# Patient Record
Sex: Female | Born: 1935 | Race: White | Hispanic: No | Marital: Married | Smoking: Never smoker
Health system: Southern US, Community
[De-identification: ages and names within clinical notes are randomized; demographics above are authoritative.]

## PROBLEM LIST (undated history)

## (undated) DIAGNOSIS — I480 Paroxysmal atrial fibrillation: Secondary | ICD-10-CM

## (undated) DIAGNOSIS — M25512 Pain in left shoulder: Secondary | ICD-10-CM

## (undated) DIAGNOSIS — R7989 Other specified abnormal findings of blood chemistry: Secondary | ICD-10-CM

## (undated) DIAGNOSIS — I2510 Atherosclerotic heart disease of native coronary artery without angina pectoris: Secondary | ICD-10-CM

## (undated) DIAGNOSIS — D539 Nutritional anemia, unspecified: Secondary | ICD-10-CM

## (undated) DIAGNOSIS — M7540 Impingement syndrome of unspecified shoulder: Secondary | ICD-10-CM

## (undated) DIAGNOSIS — M129 Arthropathy, unspecified: Secondary | ICD-10-CM

## (undated) DIAGNOSIS — F419 Anxiety disorder, unspecified: Secondary | ICD-10-CM

## (undated) DIAGNOSIS — M533 Sacrococcygeal disorders, not elsewhere classified: Secondary | ICD-10-CM

## (undated) DIAGNOSIS — I7300 Raynaud's syndrome without gangrene: Secondary | ICD-10-CM

## (undated) DIAGNOSIS — M1990 Unspecified osteoarthritis, unspecified site: Secondary | ICD-10-CM

## (undated) DIAGNOSIS — M76829 Posterior tibial tendinitis, unspecified leg: Secondary | ICD-10-CM

## (undated) DIAGNOSIS — R5383 Other fatigue: Secondary | ICD-10-CM

## (undated) DIAGNOSIS — M19079 Primary osteoarthritis, unspecified ankle and foot: Secondary | ICD-10-CM

## (undated) DIAGNOSIS — I779 Disorder of arteries and arterioles, unspecified: Secondary | ICD-10-CM

## (undated) DIAGNOSIS — M545 Low back pain, unspecified: Secondary | ICD-10-CM

## (undated) DIAGNOSIS — I739 Peripheral vascular disease, unspecified: Secondary | ICD-10-CM

## (undated) DIAGNOSIS — I10 Essential (primary) hypertension: Secondary | ICD-10-CM

## (undated) DIAGNOSIS — D649 Anemia, unspecified: Secondary | ICD-10-CM

## (undated) DIAGNOSIS — M25511 Pain in right shoulder: Secondary | ICD-10-CM

## (undated) DIAGNOSIS — G8929 Other chronic pain: Secondary | ICD-10-CM

## (undated) DIAGNOSIS — K279 Peptic ulcer, site unspecified, unspecified as acute or chronic, without hemorrhage or perforation: Secondary | ICD-10-CM

## (undated) DIAGNOSIS — F32A Depression, unspecified: Secondary | ICD-10-CM

## (undated) DIAGNOSIS — M810 Age-related osteoporosis without current pathological fracture: Secondary | ICD-10-CM

## (undated) DIAGNOSIS — K5730 Diverticulosis of large intestine without perforation or abscess without bleeding: Secondary | ICD-10-CM

## (undated) DIAGNOSIS — E785 Hyperlipidemia, unspecified: Secondary | ICD-10-CM

## (undated) DIAGNOSIS — M4800 Spinal stenosis, site unspecified: Secondary | ICD-10-CM

## (undated) DIAGNOSIS — K559 Vascular disorder of intestine, unspecified: Secondary | ICD-10-CM

## (undated) DIAGNOSIS — F329 Major depressive disorder, single episode, unspecified: Secondary | ICD-10-CM

## (undated) DIAGNOSIS — R112 Nausea with vomiting, unspecified: Secondary | ICD-10-CM

## (undated) DIAGNOSIS — H8110 Benign paroxysmal vertigo, unspecified ear: Secondary | ICD-10-CM

## (undated) HISTORY — DX: Primary osteoarthritis, unspecified ankle and foot: M19.079

## (undated) HISTORY — DX: Peripheral vascular disease, unspecified: I73.9

## (undated) HISTORY — DX: Nausea with vomiting, unspecified: R11.2

## (undated) HISTORY — PX: APPENDECTOMY: SHX54

## (undated) HISTORY — DX: Other specified abnormal findings of blood chemistry: R79.89

## (undated) HISTORY — DX: Low back pain: M54.5

## (undated) HISTORY — DX: Depression, unspecified: F32.A

## (undated) HISTORY — DX: Paroxysmal atrial fibrillation: I48.0

## (undated) HISTORY — PX: LAPAROTOMY: SHX154

## (undated) HISTORY — DX: Hyperlipidemia, unspecified: E78.5

## (undated) HISTORY — DX: Hypercalcemia: E83.52

## (undated) HISTORY — DX: Essential (primary) hypertension: I10

## (undated) HISTORY — DX: Sacrococcygeal disorders, not elsewhere classified: M53.3

## (undated) HISTORY — DX: Other fatigue: R53.83

## (undated) HISTORY — DX: Impingement syndrome of unspecified shoulder: M75.40

## (undated) HISTORY — DX: Arthropathy, unspecified: M12.9

## (undated) HISTORY — DX: Unspecified osteoarthritis, unspecified site: M19.90

## (undated) HISTORY — DX: Atherosclerotic heart disease of native coronary artery without angina pectoris: I25.10

## (undated) HISTORY — DX: Other chronic pain: G89.29

## (undated) HISTORY — PX: CATARACT EXTRACTION: SUR2

## (undated) HISTORY — DX: Peptic ulcer, site unspecified, unspecified as acute or chronic, without hemorrhage or perforation: K27.9

## (undated) HISTORY — DX: Anxiety disorder, unspecified: F41.9

## (undated) HISTORY — DX: Spinal stenosis, site unspecified: M48.00

## (undated) HISTORY — DX: Anemia, unspecified: D64.9

## (undated) HISTORY — DX: Posterior tibial tendinitis, unspecified leg: M76.829

## (undated) HISTORY — DX: Nutritional anemia, unspecified: D53.9

## (undated) HISTORY — DX: Age-related osteoporosis without current pathological fracture: M81.0

## (undated) HISTORY — DX: Benign paroxysmal vertigo, unspecified ear: H81.10

## (undated) HISTORY — DX: Low back pain, unspecified: M54.50

## (undated) HISTORY — DX: Raynaud's syndrome without gangrene: I73.00

## (undated) HISTORY — PX: CORONARY ARTERY BYPASS GRAFT: SHX141

## (undated) HISTORY — DX: Pain in right shoulder: M25.511

## (undated) HISTORY — DX: Major depressive disorder, single episode, unspecified: F32.9

## (undated) HISTORY — DX: Disorder of arteries and arterioles, unspecified: I77.9

## (undated) HISTORY — PX: CHOLECYSTECTOMY: SHX55

## (undated) HISTORY — DX: Pain in left shoulder: M25.512

---

## 1996-02-28 HISTORY — PX: HIATAL HERNIA REPAIR: SHX195

## 1999-03-23 ENCOUNTER — Encounter: Admission: RE | Admit: 1999-03-23 | Discharge: 1999-03-23 | Payer: Self-pay | Admitting: Orthopedic Surgery

## 1999-03-23 ENCOUNTER — Encounter: Payer: Self-pay | Admitting: Orthopedic Surgery

## 1999-03-25 ENCOUNTER — Ambulatory Visit (HOSPITAL_BASED_OUTPATIENT_CLINIC_OR_DEPARTMENT_OTHER): Admission: RE | Admit: 1999-03-25 | Discharge: 1999-03-25 | Payer: Self-pay | Admitting: Orthopedic Surgery

## 1999-12-30 ENCOUNTER — Encounter: Admission: RE | Admit: 1999-12-30 | Discharge: 1999-12-30 | Payer: Self-pay | Admitting: *Deleted

## 1999-12-30 ENCOUNTER — Encounter: Payer: Self-pay | Admitting: *Deleted

## 2001-05-08 ENCOUNTER — Ambulatory Visit (HOSPITAL_COMMUNITY): Admission: RE | Admit: 2001-05-08 | Discharge: 2001-05-08 | Payer: Self-pay | Admitting: Cardiology

## 2001-05-08 ENCOUNTER — Encounter: Payer: Self-pay | Admitting: Cardiology

## 2001-09-03 ENCOUNTER — Ambulatory Visit (HOSPITAL_COMMUNITY): Admission: RE | Admit: 2001-09-03 | Discharge: 2001-09-03 | Payer: Self-pay | Admitting: Family Medicine

## 2001-09-03 ENCOUNTER — Encounter: Payer: Self-pay | Admitting: Family Medicine

## 2001-09-05 ENCOUNTER — Ambulatory Visit (HOSPITAL_COMMUNITY): Admission: RE | Admit: 2001-09-05 | Discharge: 2001-09-05 | Payer: Self-pay | Admitting: Family Medicine

## 2001-09-05 ENCOUNTER — Encounter: Payer: Self-pay | Admitting: Family Medicine

## 2001-11-20 ENCOUNTER — Encounter: Admission: RE | Admit: 2001-11-20 | Discharge: 2001-11-20 | Payer: Self-pay | Admitting: Family Medicine

## 2001-11-20 ENCOUNTER — Encounter: Payer: Self-pay | Admitting: Family Medicine

## 2001-11-27 ENCOUNTER — Ambulatory Visit (HOSPITAL_COMMUNITY): Admission: RE | Admit: 2001-11-27 | Discharge: 2001-11-27 | Payer: Self-pay | Admitting: Family Medicine

## 2001-11-27 ENCOUNTER — Encounter: Payer: Self-pay | Admitting: Family Medicine

## 2002-02-23 ENCOUNTER — Inpatient Hospital Stay (HOSPITAL_COMMUNITY): Admission: EM | Admit: 2002-02-23 | Discharge: 2002-02-25 | Payer: Self-pay | Admitting: *Deleted

## 2002-02-23 ENCOUNTER — Encounter: Payer: Self-pay | Admitting: Emergency Medicine

## 2002-02-25 ENCOUNTER — Encounter: Payer: Self-pay | Admitting: Cardiology

## 2002-05-29 ENCOUNTER — Encounter: Payer: Self-pay | Admitting: Family Medicine

## 2002-05-29 ENCOUNTER — Ambulatory Visit (HOSPITAL_COMMUNITY): Admission: RE | Admit: 2002-05-29 | Discharge: 2002-05-29 | Payer: Self-pay | Admitting: Family Medicine

## 2002-09-03 ENCOUNTER — Ambulatory Visit (HOSPITAL_COMMUNITY): Admission: RE | Admit: 2002-09-03 | Discharge: 2002-09-03 | Payer: Self-pay | Admitting: Internal Medicine

## 2002-11-25 ENCOUNTER — Ambulatory Visit (HOSPITAL_COMMUNITY): Admission: RE | Admit: 2002-11-25 | Discharge: 2002-11-25 | Payer: Self-pay | Admitting: Family Medicine

## 2002-11-25 ENCOUNTER — Encounter: Payer: Self-pay | Admitting: Family Medicine

## 2002-12-01 ENCOUNTER — Ambulatory Visit (HOSPITAL_COMMUNITY): Admission: RE | Admit: 2002-12-01 | Discharge: 2002-12-01 | Payer: Self-pay | Admitting: Family Medicine

## 2002-12-01 ENCOUNTER — Encounter: Payer: Self-pay | Admitting: Family Medicine

## 2002-12-08 ENCOUNTER — Encounter: Payer: Self-pay | Admitting: Family Medicine

## 2002-12-08 ENCOUNTER — Ambulatory Visit (HOSPITAL_COMMUNITY): Admission: RE | Admit: 2002-12-08 | Discharge: 2002-12-08 | Payer: Self-pay | Admitting: Family Medicine

## 2003-10-26 ENCOUNTER — Ambulatory Visit (HOSPITAL_COMMUNITY): Admission: RE | Admit: 2003-10-26 | Discharge: 2003-10-26 | Payer: Self-pay | Admitting: Family Medicine

## 2003-12-03 ENCOUNTER — Ambulatory Visit (HOSPITAL_COMMUNITY): Admission: RE | Admit: 2003-12-03 | Discharge: 2003-12-03 | Payer: Self-pay | Admitting: Podiatry

## 2003-12-03 ENCOUNTER — Ambulatory Visit (HOSPITAL_COMMUNITY): Admission: RE | Admit: 2003-12-03 | Discharge: 2003-12-03 | Payer: Self-pay | Admitting: Family Medicine

## 2003-12-30 ENCOUNTER — Ambulatory Visit (HOSPITAL_COMMUNITY): Admission: RE | Admit: 2003-12-30 | Discharge: 2003-12-30 | Payer: Self-pay | Admitting: Podiatry

## 2004-03-18 ENCOUNTER — Ambulatory Visit: Payer: Self-pay | Admitting: *Deleted

## 2004-03-29 ENCOUNTER — Ambulatory Visit: Payer: Self-pay

## 2004-04-07 ENCOUNTER — Ambulatory Visit: Payer: Self-pay | Admitting: *Deleted

## 2004-11-24 ENCOUNTER — Ambulatory Visit: Payer: Self-pay | Admitting: Internal Medicine

## 2004-12-01 ENCOUNTER — Other Ambulatory Visit: Admission: RE | Admit: 2004-12-01 | Discharge: 2004-12-01 | Payer: Self-pay | Admitting: Dermatology

## 2004-12-05 ENCOUNTER — Ambulatory Visit (HOSPITAL_COMMUNITY): Admission: RE | Admit: 2004-12-05 | Discharge: 2004-12-05 | Payer: Self-pay | Admitting: Family Medicine

## 2004-12-07 ENCOUNTER — Ambulatory Visit (HOSPITAL_COMMUNITY): Admission: RE | Admit: 2004-12-07 | Discharge: 2004-12-07 | Payer: Self-pay | Admitting: Internal Medicine

## 2004-12-07 ENCOUNTER — Ambulatory Visit: Payer: Self-pay | Admitting: Internal Medicine

## 2004-12-07 ENCOUNTER — Encounter (INDEPENDENT_AMBULATORY_CARE_PROVIDER_SITE_OTHER): Payer: Self-pay | Admitting: Internal Medicine

## 2005-03-06 ENCOUNTER — Ambulatory Visit: Payer: Self-pay | Admitting: *Deleted

## 2005-06-19 ENCOUNTER — Ambulatory Visit (HOSPITAL_COMMUNITY): Admission: RE | Admit: 2005-06-19 | Discharge: 2005-06-19 | Payer: Self-pay | Admitting: Family Medicine

## 2005-06-21 ENCOUNTER — Ambulatory Visit: Payer: Self-pay | Admitting: *Deleted

## 2005-06-21 ENCOUNTER — Inpatient Hospital Stay (HOSPITAL_COMMUNITY): Admission: EM | Admit: 2005-06-21 | Discharge: 2005-06-26 | Payer: Self-pay | Admitting: Emergency Medicine

## 2005-07-11 ENCOUNTER — Ambulatory Visit: Payer: Self-pay | Admitting: *Deleted

## 2005-08-07 ENCOUNTER — Ambulatory Visit (HOSPITAL_COMMUNITY): Admission: RE | Admit: 2005-08-07 | Discharge: 2005-08-07 | Payer: Self-pay | Admitting: Family Medicine

## 2005-11-02 ENCOUNTER — Ambulatory Visit: Payer: Self-pay | Admitting: Cardiology

## 2005-12-07 ENCOUNTER — Ambulatory Visit (HOSPITAL_COMMUNITY): Admission: RE | Admit: 2005-12-07 | Discharge: 2005-12-07 | Payer: Self-pay | Admitting: Family Medicine

## 2006-02-07 ENCOUNTER — Ambulatory Visit: Payer: Self-pay | Admitting: Cardiology

## 2006-02-07 ENCOUNTER — Ambulatory Visit: Payer: Self-pay

## 2006-02-27 DIAGNOSIS — K5730 Diverticulosis of large intestine without perforation or abscess without bleeding: Secondary | ICD-10-CM

## 2006-02-27 HISTORY — DX: Diverticulosis of large intestine without perforation or abscess without bleeding: K57.30

## 2006-03-03 ENCOUNTER — Encounter (INDEPENDENT_AMBULATORY_CARE_PROVIDER_SITE_OTHER): Payer: Self-pay | Admitting: Family Medicine

## 2006-06-09 ENCOUNTER — Encounter (INDEPENDENT_AMBULATORY_CARE_PROVIDER_SITE_OTHER): Payer: Self-pay | Admitting: Family Medicine

## 2006-07-03 ENCOUNTER — Ambulatory Visit: Payer: Self-pay | Admitting: Internal Medicine

## 2006-07-04 ENCOUNTER — Ambulatory Visit (HOSPITAL_COMMUNITY): Admission: RE | Admit: 2006-07-04 | Discharge: 2006-07-04 | Payer: Self-pay | Admitting: Internal Medicine

## 2006-07-10 ENCOUNTER — Encounter (INDEPENDENT_AMBULATORY_CARE_PROVIDER_SITE_OTHER): Payer: Self-pay | Admitting: Specialist

## 2006-07-10 ENCOUNTER — Ambulatory Visit: Payer: Self-pay | Admitting: Internal Medicine

## 2006-07-10 ENCOUNTER — Ambulatory Visit (HOSPITAL_COMMUNITY): Admission: RE | Admit: 2006-07-10 | Discharge: 2006-07-10 | Payer: Self-pay | Admitting: Internal Medicine

## 2006-07-20 ENCOUNTER — Encounter (INDEPENDENT_AMBULATORY_CARE_PROVIDER_SITE_OTHER): Payer: Self-pay | Admitting: Family Medicine

## 2006-08-27 ENCOUNTER — Ambulatory Visit: Payer: Self-pay | Admitting: Family Medicine

## 2006-08-27 DIAGNOSIS — K5900 Constipation, unspecified: Secondary | ICD-10-CM | POA: Insufficient documentation

## 2006-08-27 DIAGNOSIS — E039 Hypothyroidism, unspecified: Secondary | ICD-10-CM | POA: Insufficient documentation

## 2006-08-27 DIAGNOSIS — Z8711 Personal history of peptic ulcer disease: Secondary | ICD-10-CM

## 2006-08-27 DIAGNOSIS — M545 Low back pain: Secondary | ICD-10-CM

## 2006-08-27 DIAGNOSIS — M1990 Unspecified osteoarthritis, unspecified site: Secondary | ICD-10-CM | POA: Insufficient documentation

## 2006-08-27 DIAGNOSIS — M129 Arthropathy, unspecified: Secondary | ICD-10-CM | POA: Insufficient documentation

## 2006-08-27 DIAGNOSIS — I252 Old myocardial infarction: Secondary | ICD-10-CM

## 2006-08-27 DIAGNOSIS — F329 Major depressive disorder, single episode, unspecified: Secondary | ICD-10-CM | POA: Insufficient documentation

## 2006-08-27 DIAGNOSIS — E785 Hyperlipidemia, unspecified: Secondary | ICD-10-CM

## 2006-08-27 DIAGNOSIS — F411 Generalized anxiety disorder: Secondary | ICD-10-CM | POA: Insufficient documentation

## 2006-08-27 DIAGNOSIS — J309 Allergic rhinitis, unspecified: Secondary | ICD-10-CM | POA: Insufficient documentation

## 2006-08-28 ENCOUNTER — Encounter (INDEPENDENT_AMBULATORY_CARE_PROVIDER_SITE_OTHER): Payer: Self-pay | Admitting: Family Medicine

## 2006-08-28 ENCOUNTER — Telehealth (INDEPENDENT_AMBULATORY_CARE_PROVIDER_SITE_OTHER): Payer: Self-pay | Admitting: Family Medicine

## 2006-08-29 ENCOUNTER — Telehealth (INDEPENDENT_AMBULATORY_CARE_PROVIDER_SITE_OTHER): Payer: Self-pay | Admitting: *Deleted

## 2006-08-29 LAB — CONVERTED CEMR LAB
ALT: 15 units/L (ref 0–35)
AST: 15 units/L (ref 0–37)
Alkaline Phosphatase: 57 units/L (ref 39–117)
Basophils Absolute: 0.1 10*3/uL (ref 0.0–0.1)
Basophils Relative: 1 % (ref 0–1)
Calcium: 9.2 mg/dL (ref 8.4–10.5)
Chloride: 112 meq/L (ref 96–112)
Creatinine, Ser: 1.03 mg/dL (ref 0.40–1.20)
Eosinophils Absolute: 0.3 10*3/uL (ref 0.0–0.7)
HDL: 72 mg/dL (ref >39)
Hemoglobin, Urine: NEGATIVE
Ketones, ur: NEGATIVE mg/dL
LDL Cholesterol: 67 mg/dL (ref 0–99)
Leukocytes, UA: NEGATIVE
MCHC: 31.1 g/dL (ref 30.0–36.0)
MCV: 103.6 fL — ABNORMAL HIGH (ref 78.0–100.0)
Monocytes Absolute: 0.4 10*3/uL (ref 0.2–0.7)
Neutro Abs: 3.5 10*3/uL (ref 1.7–7.7)
Neutrophils Relative %: 64 % (ref 43–77)
Nitrite: NEGATIVE
Potassium: 4.9 meq/L (ref 3.5–5.3)
Protein, ur: NEGATIVE mg/dL
RDW: 13.8 % (ref 11.5–14.0)
TSH: 0.479 microintl units/mL (ref 0.350–5.50)
Total CHOL/HDL Ratio: 2.2
Urobilinogen, UA: 0.2 (ref 0.0–1.0)
pH: 5.5 (ref 5.0–8.0)

## 2006-08-30 ENCOUNTER — Encounter (INDEPENDENT_AMBULATORY_CARE_PROVIDER_SITE_OTHER): Payer: Self-pay | Admitting: Family Medicine

## 2006-09-04 ENCOUNTER — Telehealth (INDEPENDENT_AMBULATORY_CARE_PROVIDER_SITE_OTHER): Payer: Self-pay | Admitting: *Deleted

## 2006-09-04 ENCOUNTER — Ambulatory Visit (HOSPITAL_COMMUNITY): Admission: RE | Admit: 2006-09-04 | Discharge: 2006-09-04 | Payer: Self-pay | Admitting: Family Medicine

## 2006-09-04 ENCOUNTER — Ambulatory Visit: Payer: Self-pay | Admitting: Family Medicine

## 2006-09-04 DIAGNOSIS — I10 Essential (primary) hypertension: Secondary | ICD-10-CM | POA: Insufficient documentation

## 2006-09-04 LAB — CONVERTED CEMR LAB: HDL goal, serum: 40 mg/dL

## 2006-09-05 ENCOUNTER — Encounter (INDEPENDENT_AMBULATORY_CARE_PROVIDER_SITE_OTHER): Payer: Self-pay | Admitting: Family Medicine

## 2006-09-06 ENCOUNTER — Encounter (INDEPENDENT_AMBULATORY_CARE_PROVIDER_SITE_OTHER): Payer: Self-pay | Admitting: Family Medicine

## 2006-09-06 ENCOUNTER — Ambulatory Visit: Payer: Self-pay | Admitting: Internal Medicine

## 2006-09-06 ENCOUNTER — Telehealth (INDEPENDENT_AMBULATORY_CARE_PROVIDER_SITE_OTHER): Payer: Self-pay | Admitting: Family Medicine

## 2006-09-08 ENCOUNTER — Ambulatory Visit: Payer: Self-pay | Admitting: Internal Medicine

## 2006-09-08 ENCOUNTER — Ambulatory Visit: Payer: Self-pay | Admitting: Cardiovascular Disease

## 2006-09-08 ENCOUNTER — Inpatient Hospital Stay (HOSPITAL_COMMUNITY): Admission: EM | Admit: 2006-09-08 | Discharge: 2006-09-20 | Payer: Self-pay | Admitting: Emergency Medicine

## 2006-09-10 ENCOUNTER — Telehealth (INDEPENDENT_AMBULATORY_CARE_PROVIDER_SITE_OTHER): Payer: Self-pay | Admitting: Family Medicine

## 2006-09-11 ENCOUNTER — Telehealth (INDEPENDENT_AMBULATORY_CARE_PROVIDER_SITE_OTHER): Payer: Self-pay | Admitting: Family Medicine

## 2006-09-11 ENCOUNTER — Ambulatory Visit: Payer: Self-pay | Admitting: Cardiovascular Disease

## 2006-09-12 ENCOUNTER — Encounter (INDEPENDENT_AMBULATORY_CARE_PROVIDER_SITE_OTHER): Payer: Self-pay | Admitting: Emergency Medicine

## 2006-09-12 ENCOUNTER — Encounter: Payer: Self-pay | Admitting: Emergency Medicine

## 2006-09-12 ENCOUNTER — Telehealth (INDEPENDENT_AMBULATORY_CARE_PROVIDER_SITE_OTHER): Payer: Self-pay | Admitting: Family Medicine

## 2006-09-12 ENCOUNTER — Ambulatory Visit: Payer: Self-pay | Admitting: Vascular Surgery

## 2006-09-19 ENCOUNTER — Encounter (INDEPENDENT_AMBULATORY_CARE_PROVIDER_SITE_OTHER): Payer: Self-pay | Admitting: Family Medicine

## 2006-09-19 ENCOUNTER — Encounter: Payer: Self-pay | Admitting: Gastroenterology

## 2006-09-20 ENCOUNTER — Encounter (INDEPENDENT_AMBULATORY_CARE_PROVIDER_SITE_OTHER): Payer: Self-pay | Admitting: Family Medicine

## 2006-09-21 ENCOUNTER — Ambulatory Visit: Payer: Self-pay | Admitting: Gastroenterology

## 2006-09-21 ENCOUNTER — Ambulatory Visit: Payer: Self-pay | Admitting: Cardiology

## 2006-09-24 ENCOUNTER — Ambulatory Visit: Payer: Self-pay | Admitting: Family Medicine

## 2006-09-24 ENCOUNTER — Telehealth (INDEPENDENT_AMBULATORY_CARE_PROVIDER_SITE_OTHER): Payer: Self-pay | Admitting: *Deleted

## 2006-09-24 DIAGNOSIS — I4891 Unspecified atrial fibrillation: Secondary | ICD-10-CM | POA: Insufficient documentation

## 2006-09-24 DIAGNOSIS — K559 Vascular disorder of intestine, unspecified: Secondary | ICD-10-CM

## 2006-09-24 HISTORY — DX: Vascular disorder of intestine, unspecified: K55.9

## 2006-09-25 ENCOUNTER — Ambulatory Visit: Payer: Self-pay | Admitting: Cardiology

## 2006-09-25 ENCOUNTER — Encounter (INDEPENDENT_AMBULATORY_CARE_PROVIDER_SITE_OTHER): Payer: Self-pay | Admitting: Family Medicine

## 2006-09-25 ENCOUNTER — Telehealth (INDEPENDENT_AMBULATORY_CARE_PROVIDER_SITE_OTHER): Payer: Self-pay | Admitting: *Deleted

## 2006-09-25 ENCOUNTER — Emergency Department (HOSPITAL_COMMUNITY): Admission: EM | Admit: 2006-09-25 | Discharge: 2006-09-25 | Payer: Self-pay | Admitting: Emergency Medicine

## 2006-09-25 LAB — CONVERTED CEMR LAB
AST: 23 units/L (ref 0–37)
Albumin: 2.7 g/dL — ABNORMAL LOW (ref 3.5–5.2)
Alkaline Phosphatase: 74 units/L (ref 39–117)
BUN: 29 mg/dL — ABNORMAL HIGH (ref 6–23)
Basophils Relative: 1 % (ref 0–1)
Creatinine, Ser: 1.38 mg/dL — ABNORMAL HIGH (ref 0.40–1.20)
Eosinophils Absolute: 0.3 10*3/uL (ref 0.0–0.7)
Eosinophils Relative: 3 % (ref 0–5)
Glucose, Bld: 102 mg/dL — ABNORMAL HIGH (ref 70–99)
HCT: 36.8 % (ref 36.0–46.0)
Hemoglobin: 12.3 g/dL (ref 12.0–15.0)
Lymphs Abs: 1.2 10*3/uL (ref 0.7–3.3)
MCHC: 33.4 g/dL (ref 30.0–36.0)
MCV: 97.2 fL (ref 78.0–100.0)
Monocytes Absolute: 0.7 10*3/uL (ref 0.2–0.7)
Monocytes Relative: 9 % (ref 3–11)
Neutrophils Relative %: 71 % (ref 43–77)
Potassium: 5.6 meq/L — ABNORMAL HIGH (ref 3.5–5.3)
RBC: 3.78 M/uL — ABNORMAL LOW (ref 3.87–5.11)
WBC: 7.7 10*3/uL (ref 4.0–10.5)

## 2006-09-26 ENCOUNTER — Ambulatory Visit: Payer: Self-pay | Admitting: Family Medicine

## 2006-09-28 ENCOUNTER — Telehealth (INDEPENDENT_AMBULATORY_CARE_PROVIDER_SITE_OTHER): Payer: Self-pay | Admitting: Family Medicine

## 2006-09-28 ENCOUNTER — Ambulatory Visit: Payer: Self-pay | Admitting: Family Medicine

## 2006-09-29 ENCOUNTER — Encounter (INDEPENDENT_AMBULATORY_CARE_PROVIDER_SITE_OTHER): Payer: Self-pay | Admitting: Family Medicine

## 2006-10-01 ENCOUNTER — Encounter (INDEPENDENT_AMBULATORY_CARE_PROVIDER_SITE_OTHER): Payer: Self-pay | Admitting: Family Medicine

## 2006-10-01 ENCOUNTER — Telehealth (INDEPENDENT_AMBULATORY_CARE_PROVIDER_SITE_OTHER): Payer: Self-pay | Admitting: *Deleted

## 2006-10-01 LAB — CONVERTED CEMR LAB
BUN: 37 mg/dL — ABNORMAL HIGH (ref 6–23)
CO2: 19 meq/L (ref 19–32)
Calcium: 8.9 mg/dL (ref 8.4–10.5)
Chloride: 108 meq/L (ref 96–112)
Creatinine, Ser: 1.17 mg/dL (ref 0.40–1.20)
Eosinophils Relative: 6 % — ABNORMAL HIGH (ref 0–5)
HCT: 37.8 % (ref 36.0–46.0)
Hemoglobin: 11.8 g/dL — ABNORMAL LOW (ref 12.0–15.0)
Lymphocytes Relative: 19 % (ref 12–46)
MCHC: 31.2 g/dL (ref 30.0–36.0)
Monocytes Absolute: 0.7 10*3/uL (ref 0.2–0.7)
Monocytes Relative: 13 % — ABNORMAL HIGH (ref 3–11)
Neutro Abs: 3.2 10*3/uL (ref 1.7–7.7)
RBC: 3.7 M/uL — ABNORMAL LOW (ref 3.87–5.11)
Total Bilirubin: 0.2 mg/dL — ABNORMAL LOW (ref 0.3–1.2)

## 2006-10-03 ENCOUNTER — Telehealth (INDEPENDENT_AMBULATORY_CARE_PROVIDER_SITE_OTHER): Payer: Self-pay | Admitting: Family Medicine

## 2006-10-04 ENCOUNTER — Encounter (INDEPENDENT_AMBULATORY_CARE_PROVIDER_SITE_OTHER): Payer: Self-pay | Admitting: Family Medicine

## 2006-10-05 ENCOUNTER — Telehealth (INDEPENDENT_AMBULATORY_CARE_PROVIDER_SITE_OTHER): Payer: Self-pay | Admitting: Family Medicine

## 2006-10-08 ENCOUNTER — Encounter (INDEPENDENT_AMBULATORY_CARE_PROVIDER_SITE_OTHER): Payer: Self-pay | Admitting: Family Medicine

## 2006-10-10 ENCOUNTER — Encounter (INDEPENDENT_AMBULATORY_CARE_PROVIDER_SITE_OTHER): Payer: Self-pay | Admitting: Family Medicine

## 2006-10-11 ENCOUNTER — Ambulatory Visit: Payer: Self-pay | Admitting: Cardiovascular Disease

## 2006-10-12 ENCOUNTER — Telehealth (INDEPENDENT_AMBULATORY_CARE_PROVIDER_SITE_OTHER): Payer: Self-pay | Admitting: Family Medicine

## 2006-10-12 ENCOUNTER — Encounter (INDEPENDENT_AMBULATORY_CARE_PROVIDER_SITE_OTHER): Payer: Self-pay | Admitting: Family Medicine

## 2006-10-15 ENCOUNTER — Encounter (INDEPENDENT_AMBULATORY_CARE_PROVIDER_SITE_OTHER): Payer: Self-pay | Admitting: Family Medicine

## 2006-10-18 ENCOUNTER — Encounter (INDEPENDENT_AMBULATORY_CARE_PROVIDER_SITE_OTHER): Payer: Self-pay | Admitting: Family Medicine

## 2006-10-24 ENCOUNTER — Encounter (INDEPENDENT_AMBULATORY_CARE_PROVIDER_SITE_OTHER): Payer: Self-pay | Admitting: Family Medicine

## 2006-10-25 ENCOUNTER — Ambulatory Visit: Payer: Self-pay | Admitting: Cardiovascular Disease

## 2006-11-02 ENCOUNTER — Encounter (INDEPENDENT_AMBULATORY_CARE_PROVIDER_SITE_OTHER): Payer: Self-pay | Admitting: Family Medicine

## 2006-11-07 ENCOUNTER — Encounter (INDEPENDENT_AMBULATORY_CARE_PROVIDER_SITE_OTHER): Payer: Self-pay | Admitting: Family Medicine

## 2006-11-12 ENCOUNTER — Telehealth (INDEPENDENT_AMBULATORY_CARE_PROVIDER_SITE_OTHER): Payer: Self-pay | Admitting: Family Medicine

## 2006-11-15 ENCOUNTER — Encounter (INDEPENDENT_AMBULATORY_CARE_PROVIDER_SITE_OTHER): Payer: Self-pay | Admitting: Family Medicine

## 2006-11-22 ENCOUNTER — Encounter (INDEPENDENT_AMBULATORY_CARE_PROVIDER_SITE_OTHER): Payer: Self-pay | Admitting: Family Medicine

## 2006-11-28 ENCOUNTER — Encounter (INDEPENDENT_AMBULATORY_CARE_PROVIDER_SITE_OTHER): Payer: Self-pay | Admitting: Family Medicine

## 2006-12-05 ENCOUNTER — Encounter (INDEPENDENT_AMBULATORY_CARE_PROVIDER_SITE_OTHER): Payer: Self-pay | Admitting: Family Medicine

## 2006-12-10 ENCOUNTER — Ambulatory Visit (HOSPITAL_COMMUNITY): Admission: RE | Admit: 2006-12-10 | Discharge: 2006-12-10 | Payer: Self-pay | Admitting: Family Medicine

## 2006-12-12 ENCOUNTER — Telehealth (INDEPENDENT_AMBULATORY_CARE_PROVIDER_SITE_OTHER): Payer: Self-pay | Admitting: *Deleted

## 2006-12-17 ENCOUNTER — Ambulatory Visit: Payer: Self-pay | Admitting: Cardiology

## 2006-12-17 ENCOUNTER — Ambulatory Visit (HOSPITAL_COMMUNITY): Admission: RE | Admit: 2006-12-17 | Discharge: 2006-12-17 | Payer: Self-pay | Admitting: Family Medicine

## 2006-12-17 ENCOUNTER — Ambulatory Visit: Payer: Self-pay | Admitting: Family Medicine

## 2006-12-18 ENCOUNTER — Telehealth (INDEPENDENT_AMBULATORY_CARE_PROVIDER_SITE_OTHER): Payer: Self-pay | Admitting: *Deleted

## 2006-12-31 ENCOUNTER — Ambulatory Visit: Payer: Self-pay | Admitting: Cardiology

## 2007-01-07 ENCOUNTER — Ambulatory Visit: Payer: Self-pay | Admitting: Family Medicine

## 2007-01-07 LAB — CONVERTED CEMR LAB

## 2007-01-08 ENCOUNTER — Telehealth (INDEPENDENT_AMBULATORY_CARE_PROVIDER_SITE_OTHER): Payer: Self-pay | Admitting: *Deleted

## 2007-01-08 LAB — CONVERTED CEMR LAB
ALT: 17 units/L (ref 0–35)
BUN: 24 mg/dL — ABNORMAL HIGH (ref 6–23)
Basophils Absolute: 0.1 10*3/uL (ref 0.0–0.1)
CO2: 26 meq/L (ref 19–32)
Calcium: 9.2 mg/dL (ref 8.4–10.5)
Chloride: 105 meq/L (ref 96–112)
Cholesterol: 210 mg/dL — ABNORMAL HIGH (ref 0–200)
Creatinine, Ser: 1.17 mg/dL (ref 0.40–1.20)
Glucose, Bld: 100 mg/dL — ABNORMAL HIGH (ref 70–99)
Hemoglobin: 13.3 g/dL (ref 12.0–15.0)
Lymphocytes Relative: 20 % (ref 12–46)
Lymphs Abs: 1 10*3/uL (ref 0.7–4.0)
Monocytes Absolute: 0.3 10*3/uL (ref 0.1–1.0)
Monocytes Relative: 7 % (ref 3–12)
Neutro Abs: 3.4 10*3/uL (ref 1.7–7.7)
RBC: 4.26 M/uL (ref 3.87–5.11)
Total CHOL/HDL Ratio: 2.8
Triglycerides: 137 mg/dL (ref <150)
WBC: 5 10*3/uL (ref 4.0–10.5)

## 2007-01-10 ENCOUNTER — Encounter (INDEPENDENT_AMBULATORY_CARE_PROVIDER_SITE_OTHER): Payer: Self-pay | Admitting: Family Medicine

## 2007-01-18 ENCOUNTER — Encounter (INDEPENDENT_AMBULATORY_CARE_PROVIDER_SITE_OTHER): Payer: Self-pay | Admitting: Family Medicine

## 2007-01-21 ENCOUNTER — Ambulatory Visit: Payer: Self-pay | Admitting: Family Medicine

## 2007-01-21 ENCOUNTER — Ambulatory Visit: Payer: Self-pay | Admitting: Cardiology

## 2007-01-21 DIAGNOSIS — M25519 Pain in unspecified shoulder: Secondary | ICD-10-CM

## 2007-01-22 ENCOUNTER — Telehealth (INDEPENDENT_AMBULATORY_CARE_PROVIDER_SITE_OTHER): Payer: Self-pay | Admitting: *Deleted

## 2007-01-31 ENCOUNTER — Ambulatory Visit: Payer: Self-pay | Admitting: Cardiovascular Disease

## 2007-02-01 ENCOUNTER — Ambulatory Visit: Payer: Self-pay | Admitting: Family Medicine

## 2007-02-01 ENCOUNTER — Telehealth (INDEPENDENT_AMBULATORY_CARE_PROVIDER_SITE_OTHER): Payer: Self-pay | Admitting: Family Medicine

## 2007-02-04 ENCOUNTER — Telehealth (INDEPENDENT_AMBULATORY_CARE_PROVIDER_SITE_OTHER): Payer: Self-pay | Admitting: *Deleted

## 2007-02-05 ENCOUNTER — Telehealth (INDEPENDENT_AMBULATORY_CARE_PROVIDER_SITE_OTHER): Payer: Self-pay | Admitting: Family Medicine

## 2007-02-07 ENCOUNTER — Ambulatory Visit: Payer: Self-pay | Admitting: Family Medicine

## 2007-02-14 ENCOUNTER — Ambulatory Visit (HOSPITAL_COMMUNITY): Admission: RE | Admit: 2007-02-14 | Discharge: 2007-02-14 | Payer: Self-pay | Admitting: Family Medicine

## 2007-02-14 ENCOUNTER — Encounter: Payer: Self-pay | Admitting: Orthopedic Surgery

## 2007-02-14 ENCOUNTER — Ambulatory Visit: Payer: Self-pay | Admitting: Family Medicine

## 2007-02-15 ENCOUNTER — Encounter (INDEPENDENT_AMBULATORY_CARE_PROVIDER_SITE_OTHER): Payer: Self-pay | Admitting: Family Medicine

## 2007-02-15 ENCOUNTER — Telehealth (INDEPENDENT_AMBULATORY_CARE_PROVIDER_SITE_OTHER): Payer: Self-pay | Admitting: *Deleted

## 2007-02-18 ENCOUNTER — Ambulatory Visit: Payer: Self-pay | Admitting: Internal Medicine

## 2007-03-01 ENCOUNTER — Ambulatory Visit: Payer: Self-pay | Admitting: Family Medicine

## 2007-03-01 ENCOUNTER — Telehealth (INDEPENDENT_AMBULATORY_CARE_PROVIDER_SITE_OTHER): Payer: Self-pay | Admitting: *Deleted

## 2007-03-07 ENCOUNTER — Encounter (INDEPENDENT_AMBULATORY_CARE_PROVIDER_SITE_OTHER): Payer: Self-pay | Admitting: Family Medicine

## 2007-03-07 ENCOUNTER — Telehealth (INDEPENDENT_AMBULATORY_CARE_PROVIDER_SITE_OTHER): Payer: Self-pay | Admitting: *Deleted

## 2007-03-20 ENCOUNTER — Ambulatory Visit: Payer: Self-pay | Admitting: Family Medicine

## 2007-03-20 ENCOUNTER — Ambulatory Visit: Payer: Self-pay | Admitting: Cardiology

## 2007-03-20 ENCOUNTER — Telehealth (INDEPENDENT_AMBULATORY_CARE_PROVIDER_SITE_OTHER): Payer: Self-pay | Admitting: *Deleted

## 2007-03-20 DIAGNOSIS — M810 Age-related osteoporosis without current pathological fracture: Secondary | ICD-10-CM | POA: Insufficient documentation

## 2007-03-26 ENCOUNTER — Ambulatory Visit (HOSPITAL_COMMUNITY): Admission: RE | Admit: 2007-03-26 | Discharge: 2007-03-26 | Payer: Self-pay | Admitting: Family Medicine

## 2007-03-26 ENCOUNTER — Encounter (INDEPENDENT_AMBULATORY_CARE_PROVIDER_SITE_OTHER): Payer: Self-pay | Admitting: Family Medicine

## 2007-03-27 ENCOUNTER — Telehealth (INDEPENDENT_AMBULATORY_CARE_PROVIDER_SITE_OTHER): Payer: Self-pay | Admitting: *Deleted

## 2007-04-08 ENCOUNTER — Encounter (INDEPENDENT_AMBULATORY_CARE_PROVIDER_SITE_OTHER): Payer: Self-pay | Admitting: Family Medicine

## 2007-04-17 ENCOUNTER — Ambulatory Visit: Payer: Self-pay | Admitting: Internal Medicine

## 2007-04-17 ENCOUNTER — Ambulatory Visit: Payer: Self-pay | Admitting: Cardiology

## 2007-04-17 ENCOUNTER — Encounter (INDEPENDENT_AMBULATORY_CARE_PROVIDER_SITE_OTHER): Payer: Self-pay | Admitting: Family Medicine

## 2007-04-18 ENCOUNTER — Telehealth (INDEPENDENT_AMBULATORY_CARE_PROVIDER_SITE_OTHER): Payer: Self-pay | Admitting: *Deleted

## 2007-04-18 LAB — CONVERTED CEMR LAB
Albumin: 4.3 g/dL (ref 3.5–5.2)
CO2: 20 meq/L (ref 19–32)
Cholesterol: 170 mg/dL (ref 0–200)
Glucose, Bld: 100 mg/dL — ABNORMAL HIGH (ref 70–99)
LDL Cholesterol: 56 mg/dL (ref 0–99)
Potassium: 5 meq/L (ref 3.5–5.3)
Sodium: 147 meq/L — ABNORMAL HIGH (ref 135–145)
Total Protein: 7.3 g/dL (ref 6.0–8.3)
Triglycerides: 159 mg/dL — ABNORMAL HIGH (ref <150)

## 2007-04-19 ENCOUNTER — Encounter (INDEPENDENT_AMBULATORY_CARE_PROVIDER_SITE_OTHER): Payer: Self-pay | Admitting: Family Medicine

## 2007-04-22 ENCOUNTER — Telehealth (INDEPENDENT_AMBULATORY_CARE_PROVIDER_SITE_OTHER): Payer: Self-pay | Admitting: *Deleted

## 2007-05-01 ENCOUNTER — Encounter (INDEPENDENT_AMBULATORY_CARE_PROVIDER_SITE_OTHER): Payer: Self-pay | Admitting: Family Medicine

## 2007-05-08 ENCOUNTER — Ambulatory Visit: Payer: Self-pay | Admitting: Orthopedic Surgery

## 2007-05-08 DIAGNOSIS — M25819 Other specified joint disorders, unspecified shoulder: Secondary | ICD-10-CM | POA: Insufficient documentation

## 2007-05-08 DIAGNOSIS — M7580 Other shoulder lesions, unspecified shoulder: Secondary | ICD-10-CM

## 2007-05-13 ENCOUNTER — Telehealth: Payer: Self-pay | Admitting: Orthopedic Surgery

## 2007-05-14 ENCOUNTER — Ambulatory Visit: Payer: Self-pay | Admitting: Family Medicine

## 2007-05-15 ENCOUNTER — Encounter (INDEPENDENT_AMBULATORY_CARE_PROVIDER_SITE_OTHER): Payer: Self-pay | Admitting: Family Medicine

## 2007-05-16 ENCOUNTER — Ambulatory Visit: Payer: Self-pay | Admitting: Cardiology

## 2007-05-27 ENCOUNTER — Telehealth: Payer: Self-pay | Admitting: Orthopedic Surgery

## 2007-05-27 ENCOUNTER — Ambulatory Visit: Payer: Self-pay | Admitting: Orthopedic Surgery

## 2007-05-28 ENCOUNTER — Encounter (INDEPENDENT_AMBULATORY_CARE_PROVIDER_SITE_OTHER): Payer: Self-pay | Admitting: Family Medicine

## 2007-05-28 ENCOUNTER — Telehealth: Payer: Self-pay | Admitting: Orthopedic Surgery

## 2007-05-29 ENCOUNTER — Telehealth (INDEPENDENT_AMBULATORY_CARE_PROVIDER_SITE_OTHER): Payer: Self-pay | Admitting: Family Medicine

## 2007-05-30 ENCOUNTER — Telehealth: Payer: Self-pay | Admitting: Orthopedic Surgery

## 2007-05-31 ENCOUNTER — Telehealth: Payer: Self-pay | Admitting: Orthopedic Surgery

## 2007-06-03 ENCOUNTER — Ambulatory Visit: Payer: Self-pay | Admitting: Cardiovascular Disease

## 2007-06-03 ENCOUNTER — Encounter (INDEPENDENT_AMBULATORY_CARE_PROVIDER_SITE_OTHER): Payer: Self-pay | Admitting: Family Medicine

## 2007-06-03 ENCOUNTER — Ambulatory Visit: Payer: Self-pay

## 2007-06-14 ENCOUNTER — Emergency Department (HOSPITAL_COMMUNITY): Admission: EM | Admit: 2007-06-14 | Discharge: 2007-06-14 | Payer: Self-pay | Admitting: Emergency Medicine

## 2007-06-14 ENCOUNTER — Ambulatory Visit: Payer: Self-pay | Admitting: Internal Medicine

## 2007-06-14 ENCOUNTER — Encounter: Payer: Self-pay | Admitting: Orthopedic Surgery

## 2007-06-14 ENCOUNTER — Telehealth (INDEPENDENT_AMBULATORY_CARE_PROVIDER_SITE_OTHER): Payer: Self-pay | Admitting: Family Medicine

## 2007-06-14 ENCOUNTER — Ambulatory Visit: Payer: Self-pay | Admitting: Cardiology

## 2007-06-14 DIAGNOSIS — R9431 Abnormal electrocardiogram [ECG] [EKG]: Secondary | ICD-10-CM | POA: Insufficient documentation

## 2007-06-14 LAB — CONVERTED CEMR LAB
Glucose, Urine, Semiquant: NEGATIVE
Hemoglobin: 10.1 g/dL
Ketones, urine, test strip: NEGATIVE
Specific Gravity, Urine: 1.02
WBC Urine, dipstick: NEGATIVE
pH: 5

## 2007-06-15 ENCOUNTER — Encounter (INDEPENDENT_AMBULATORY_CARE_PROVIDER_SITE_OTHER): Payer: Self-pay | Admitting: Internal Medicine

## 2007-06-17 ENCOUNTER — Telehealth (INDEPENDENT_AMBULATORY_CARE_PROVIDER_SITE_OTHER): Payer: Self-pay | Admitting: *Deleted

## 2007-06-17 ENCOUNTER — Telehealth (INDEPENDENT_AMBULATORY_CARE_PROVIDER_SITE_OTHER): Payer: Self-pay | Admitting: Internal Medicine

## 2007-06-19 LAB — CONVERTED CEMR LAB
ALT: 146 units/L — ABNORMAL HIGH (ref 0–35)
AST: 133 units/L — ABNORMAL HIGH (ref 0–37)
Alkaline Phosphatase: 78 units/L (ref 39–117)
BUN: 22 mg/dL (ref 6–23)
Basophils Absolute: 0 10*3/uL (ref 0.0–0.1)
Basophils Relative: 0 % (ref 0–1)
Chloride: 110 meq/L (ref 96–112)
Creatinine, Ser: 1.15 mg/dL (ref 0.40–1.20)
Eosinophils Absolute: 0 10*3/uL (ref 0.0–0.7)
Eosinophils Relative: 0 % (ref 0–5)
Hemoglobin: 10.5 g/dL — ABNORMAL LOW (ref 12.0–15.0)
MCHC: 31.3 g/dL (ref 30.0–36.0)
MCV: 100.3 fL — ABNORMAL HIGH (ref 78.0–100.0)
Monocytes Absolute: 0.4 10*3/uL (ref 0.1–1.0)
Neutro Abs: 3.3 10*3/uL (ref 1.7–7.7)
RDW: 13.9 % (ref 11.5–15.5)
TIBC: 281 ug/dL (ref 250–470)
Total Bilirubin: 0.2 mg/dL — ABNORMAL LOW (ref 0.3–1.2)

## 2007-06-20 ENCOUNTER — Telehealth (INDEPENDENT_AMBULATORY_CARE_PROVIDER_SITE_OTHER): Payer: Self-pay | Admitting: *Deleted

## 2007-06-20 ENCOUNTER — Encounter: Payer: Self-pay | Admitting: Orthopedic Surgery

## 2007-06-20 ENCOUNTER — Encounter (INDEPENDENT_AMBULATORY_CARE_PROVIDER_SITE_OTHER): Payer: Self-pay | Admitting: Family Medicine

## 2007-06-21 ENCOUNTER — Telehealth (INDEPENDENT_AMBULATORY_CARE_PROVIDER_SITE_OTHER): Payer: Self-pay | Admitting: Family Medicine

## 2007-06-24 ENCOUNTER — Telehealth (INDEPENDENT_AMBULATORY_CARE_PROVIDER_SITE_OTHER): Payer: Self-pay | Admitting: Family Medicine

## 2007-06-25 ENCOUNTER — Encounter (INDEPENDENT_AMBULATORY_CARE_PROVIDER_SITE_OTHER): Payer: Self-pay | Admitting: Family Medicine

## 2007-06-26 ENCOUNTER — Encounter (INDEPENDENT_AMBULATORY_CARE_PROVIDER_SITE_OTHER): Payer: Self-pay | Admitting: Family Medicine

## 2007-06-26 ENCOUNTER — Encounter: Payer: Self-pay | Admitting: Orthopedic Surgery

## 2007-06-27 ENCOUNTER — Encounter (INDEPENDENT_AMBULATORY_CARE_PROVIDER_SITE_OTHER): Payer: Self-pay | Admitting: Family Medicine

## 2007-07-03 ENCOUNTER — Encounter (INDEPENDENT_AMBULATORY_CARE_PROVIDER_SITE_OTHER): Payer: Self-pay | Admitting: Family Medicine

## 2007-07-04 ENCOUNTER — Ambulatory Visit: Payer: Self-pay | Admitting: Family Medicine

## 2007-07-04 DIAGNOSIS — D509 Iron deficiency anemia, unspecified: Secondary | ICD-10-CM

## 2007-07-04 DIAGNOSIS — R945 Abnormal results of liver function studies: Secondary | ICD-10-CM

## 2007-07-04 LAB — CONVERTED CEMR LAB: Hemoglobin: 12.3 g/dL

## 2007-07-05 ENCOUNTER — Telehealth (INDEPENDENT_AMBULATORY_CARE_PROVIDER_SITE_OTHER): Payer: Self-pay | Admitting: *Deleted

## 2007-07-05 LAB — CONVERTED CEMR LAB
Albumin: 4.2 g/dL (ref 3.5–5.2)
Alkaline Phosphatase: 55 units/L (ref 39–117)
BUN: 47 mg/dL — ABNORMAL HIGH (ref 6–23)
CO2: 17 meq/L — ABNORMAL LOW (ref 19–32)
Eosinophils Absolute: 0.2 10*3/uL (ref 0.0–0.7)
Eosinophils Relative: 2 % (ref 0–5)
Glucose, Bld: 84 mg/dL (ref 70–99)
HCT: 38.1 % (ref 36.0–46.0)
Lymphocytes Relative: 18 % (ref 12–46)
Lymphs Abs: 1.1 10*3/uL (ref 0.7–4.0)
MCV: 103 fL — ABNORMAL HIGH (ref 78.0–100.0)
Monocytes Relative: 7 % (ref 3–12)
Potassium: 4.8 meq/L (ref 3.5–5.3)
RBC: 3.7 M/uL — ABNORMAL LOW (ref 3.87–5.11)
Sodium: 141 meq/L (ref 135–145)
Total Bilirubin: 0.4 mg/dL (ref 0.3–1.2)
Total Protein: 7.3 g/dL (ref 6.0–8.3)
WBC: 6.3 10*3/uL (ref 4.0–10.5)

## 2007-07-10 ENCOUNTER — Telehealth (INDEPENDENT_AMBULATORY_CARE_PROVIDER_SITE_OTHER): Payer: Self-pay | Admitting: Family Medicine

## 2007-07-10 ENCOUNTER — Encounter (INDEPENDENT_AMBULATORY_CARE_PROVIDER_SITE_OTHER): Payer: Self-pay | Admitting: Family Medicine

## 2007-07-12 ENCOUNTER — Ambulatory Visit: Payer: Self-pay | Admitting: Cardiology

## 2007-07-26 ENCOUNTER — Encounter (INDEPENDENT_AMBULATORY_CARE_PROVIDER_SITE_OTHER): Payer: Self-pay | Admitting: Family Medicine

## 2007-08-15 ENCOUNTER — Ambulatory Visit: Payer: Self-pay | Admitting: Family Medicine

## 2007-08-15 ENCOUNTER — Ambulatory Visit: Payer: Self-pay | Admitting: Cardiology

## 2007-08-23 ENCOUNTER — Ambulatory Visit: Payer: Self-pay | Admitting: Family Medicine

## 2007-08-23 DIAGNOSIS — I7300 Raynaud's syndrome without gangrene: Secondary | ICD-10-CM | POA: Insufficient documentation

## 2007-08-23 DIAGNOSIS — IMO0002 Reserved for concepts with insufficient information to code with codable children: Secondary | ICD-10-CM | POA: Insufficient documentation

## 2007-08-27 ENCOUNTER — Telehealth (INDEPENDENT_AMBULATORY_CARE_PROVIDER_SITE_OTHER): Payer: Self-pay | Admitting: Family Medicine

## 2007-08-28 LAB — CONVERTED CEMR LAB
Folate: >20 ng/mL
Vitamin B-12: 324 pg/mL (ref 211–911)

## 2007-08-29 ENCOUNTER — Ambulatory Visit: Payer: Self-pay | Admitting: Cardiology

## 2007-09-02 ENCOUNTER — Telehealth (INDEPENDENT_AMBULATORY_CARE_PROVIDER_SITE_OTHER): Payer: Self-pay | Admitting: Family Medicine

## 2007-09-24 ENCOUNTER — Ambulatory Visit: Payer: Self-pay | Admitting: Family Medicine

## 2007-10-02 ENCOUNTER — Ambulatory Visit: Payer: Self-pay | Admitting: Cardiology

## 2007-10-02 ENCOUNTER — Telehealth (INDEPENDENT_AMBULATORY_CARE_PROVIDER_SITE_OTHER): Payer: Self-pay | Admitting: *Deleted

## 2007-10-22 ENCOUNTER — Telehealth (INDEPENDENT_AMBULATORY_CARE_PROVIDER_SITE_OTHER): Payer: Self-pay | Admitting: *Deleted

## 2007-10-22 ENCOUNTER — Ambulatory Visit: Payer: Self-pay | Admitting: Family Medicine

## 2007-10-23 ENCOUNTER — Encounter (INDEPENDENT_AMBULATORY_CARE_PROVIDER_SITE_OTHER): Payer: Self-pay | Admitting: Family Medicine

## 2007-10-24 LAB — CONVERTED CEMR LAB
CO2: 15 meq/L — ABNORMAL LOW (ref 19–32)
Calcium: 8.9 mg/dL (ref 8.4–10.5)
Creatinine, Ser: 1.19 mg/dL (ref 0.40–1.20)
Sodium: 138 meq/L (ref 135–145)
TSH: 0.114 microintl units/mL — ABNORMAL LOW (ref 0.350–4.50)

## 2007-10-30 ENCOUNTER — Ambulatory Visit: Payer: Self-pay | Admitting: Cardiology

## 2007-11-13 ENCOUNTER — Ambulatory Visit: Payer: Self-pay | Admitting: Cardiology

## 2007-11-28 ENCOUNTER — Ambulatory Visit: Payer: Self-pay | Admitting: Cardiology

## 2007-12-09 ENCOUNTER — Ambulatory Visit: Payer: Self-pay | Admitting: Cardiology

## 2007-12-11 ENCOUNTER — Ambulatory Visit (HOSPITAL_COMMUNITY): Admission: RE | Admit: 2007-12-11 | Discharge: 2007-12-11 | Payer: Self-pay | Admitting: Family Medicine

## 2007-12-11 ENCOUNTER — Ambulatory Visit: Payer: Self-pay | Admitting: Orthopedic Surgery

## 2007-12-19 ENCOUNTER — Ambulatory Visit: Payer: Self-pay | Admitting: Cardiology

## 2007-12-26 ENCOUNTER — Ambulatory Visit: Payer: Self-pay | Admitting: Family Medicine

## 2008-01-06 ENCOUNTER — Ambulatory Visit: Payer: Self-pay | Admitting: Cardiology

## 2008-01-06 ENCOUNTER — Ambulatory Visit: Payer: Self-pay | Admitting: Family Medicine

## 2008-01-06 DIAGNOSIS — R5381 Other malaise: Secondary | ICD-10-CM

## 2008-01-06 DIAGNOSIS — R5383 Other fatigue: Secondary | ICD-10-CM

## 2008-01-06 LAB — CONVERTED CEMR LAB
Bilirubin Urine: NEGATIVE
Glucose, Bld: 109 mg/dL
Hemoglobin: 12.4 g/dL
Urobilinogen, UA: 0.2
WBC Urine, dipstick: NEGATIVE
pH: 5

## 2008-01-13 ENCOUNTER — Ambulatory Visit: Payer: Self-pay | Admitting: Family Medicine

## 2008-01-13 DIAGNOSIS — R7989 Other specified abnormal findings of blood chemistry: Secondary | ICD-10-CM | POA: Insufficient documentation

## 2008-01-13 DIAGNOSIS — R112 Nausea with vomiting, unspecified: Secondary | ICD-10-CM

## 2008-01-13 DIAGNOSIS — D539 Nutritional anemia, unspecified: Secondary | ICD-10-CM | POA: Insufficient documentation

## 2008-01-13 HISTORY — DX: Nausea with vomiting, unspecified: R11.2

## 2008-01-13 LAB — CONVERTED CEMR LAB
Basophils Absolute: 0 10*3/uL (ref 0.0–0.1)
Basophils Relative: 1 % (ref 0–1)
Calcium: 10.8 mg/dL — ABNORMAL HIGH (ref 8.4–10.5)
Creatinine, Ser: 1.4 mg/dL — ABNORMAL HIGH (ref 0.40–1.20)
Eosinophils Relative: 9 % — ABNORMAL HIGH (ref 0–5)
HCT: 37.2 % (ref 36.0–46.0)
Hemoglobin: 11.9 g/dL — ABNORMAL LOW (ref 12.0–15.0)
MCHC: 32 g/dL (ref 30.0–36.0)
Monocytes Absolute: 0.7 10*3/uL (ref 0.1–1.0)
Neutro Abs: 4.2 10*3/uL (ref 1.7–7.7)
Platelets: 271 10*3/uL (ref 150–400)
RDW: 13.6 % (ref 11.5–15.5)
Sodium: 137 meq/L (ref 135–145)

## 2008-01-14 ENCOUNTER — Telehealth (INDEPENDENT_AMBULATORY_CARE_PROVIDER_SITE_OTHER): Payer: Self-pay | Admitting: *Deleted

## 2008-01-14 ENCOUNTER — Encounter (INDEPENDENT_AMBULATORY_CARE_PROVIDER_SITE_OTHER): Payer: Self-pay | Admitting: Family Medicine

## 2008-01-14 LAB — CONVERTED CEMR LAB
Ferritin: 92 ng/mL (ref 10–291)
Folate: >20 ng/mL
Potassium: 4.5 meq/L (ref 3.5–5.3)
Sodium: 138 meq/L (ref 135–145)
Vitamin B-12: >2000 pg/mL — ABNORMAL HIGH (ref 211–911)

## 2008-01-16 ENCOUNTER — Ambulatory Visit: Payer: Self-pay | Admitting: Cardiology

## 2008-01-26 DIAGNOSIS — I2510 Atherosclerotic heart disease of native coronary artery without angina pectoris: Secondary | ICD-10-CM | POA: Insufficient documentation

## 2008-01-27 ENCOUNTER — Ambulatory Visit: Payer: Self-pay | Admitting: Family Medicine

## 2008-02-03 ENCOUNTER — Ambulatory Visit: Payer: Self-pay | Admitting: Cardiology

## 2008-02-13 ENCOUNTER — Ambulatory Visit: Payer: Self-pay | Admitting: Cardiology

## 2008-03-02 ENCOUNTER — Ambulatory Visit: Payer: Self-pay | Admitting: Cardiology

## 2008-03-02 ENCOUNTER — Ambulatory Visit: Payer: Self-pay | Admitting: Family Medicine

## 2008-03-02 DIAGNOSIS — J019 Acute sinusitis, unspecified: Secondary | ICD-10-CM | POA: Insufficient documentation

## 2008-03-02 DIAGNOSIS — H8110 Benign paroxysmal vertigo, unspecified ear: Secondary | ICD-10-CM | POA: Insufficient documentation

## 2008-03-18 ENCOUNTER — Encounter (INDEPENDENT_AMBULATORY_CARE_PROVIDER_SITE_OTHER): Payer: Self-pay | Admitting: Family Medicine

## 2008-03-26 ENCOUNTER — Ambulatory Visit: Payer: Self-pay | Admitting: Cardiology

## 2008-04-16 ENCOUNTER — Ambulatory Visit: Payer: Self-pay | Admitting: Cardiology

## 2008-05-14 ENCOUNTER — Ambulatory Visit: Payer: Self-pay | Admitting: Cardiology

## 2008-06-08 ENCOUNTER — Encounter (INDEPENDENT_AMBULATORY_CARE_PROVIDER_SITE_OTHER): Payer: Self-pay | Admitting: Family Medicine

## 2008-06-11 ENCOUNTER — Ambulatory Visit: Payer: Self-pay | Admitting: Cardiology

## 2008-06-16 ENCOUNTER — Encounter: Payer: Self-pay | Admitting: Cardiovascular Disease

## 2008-06-16 ENCOUNTER — Ambulatory Visit: Payer: Self-pay | Admitting: Cardiovascular Disease

## 2008-06-16 ENCOUNTER — Ambulatory Visit: Payer: Self-pay

## 2008-06-20 ENCOUNTER — Encounter (INDEPENDENT_AMBULATORY_CARE_PROVIDER_SITE_OTHER): Payer: Self-pay | Admitting: Family Medicine

## 2008-06-29 ENCOUNTER — Ambulatory Visit: Payer: Self-pay | Admitting: Orthopedic Surgery

## 2008-06-29 DIAGNOSIS — M76829 Posterior tibial tendinitis, unspecified leg: Secondary | ICD-10-CM

## 2008-07-09 ENCOUNTER — Encounter: Payer: Self-pay | Admitting: Cardiology

## 2008-07-13 ENCOUNTER — Encounter: Payer: Self-pay | Admitting: Cardiology

## 2008-07-13 ENCOUNTER — Ambulatory Visit: Payer: Self-pay | Admitting: Cardiology

## 2008-08-03 ENCOUNTER — Ambulatory Visit: Payer: Self-pay | Admitting: Cardiology

## 2008-08-17 ENCOUNTER — Ambulatory Visit: Payer: Self-pay | Admitting: Orthopedic Surgery

## 2008-08-17 DIAGNOSIS — M19079 Primary osteoarthritis, unspecified ankle and foot: Secondary | ICD-10-CM | POA: Insufficient documentation

## 2008-09-14 ENCOUNTER — Ambulatory Visit: Payer: Self-pay | Admitting: Cardiology

## 2008-09-24 ENCOUNTER — Encounter (INDEPENDENT_AMBULATORY_CARE_PROVIDER_SITE_OTHER): Payer: Self-pay | Admitting: Family Medicine

## 2008-10-07 ENCOUNTER — Ambulatory Visit: Payer: Self-pay | Admitting: Cardiology

## 2008-10-12 ENCOUNTER — Encounter: Payer: Self-pay | Admitting: *Deleted

## 2008-10-26 ENCOUNTER — Emergency Department (HOSPITAL_COMMUNITY): Admission: EM | Admit: 2008-10-26 | Discharge: 2008-10-26 | Payer: Self-pay | Admitting: Emergency Medicine

## 2008-10-26 ENCOUNTER — Telehealth: Payer: Self-pay | Admitting: Cardiovascular Disease

## 2008-10-26 ENCOUNTER — Ambulatory Visit: Payer: Self-pay | Admitting: Cardiovascular Disease

## 2008-10-27 ENCOUNTER — Telehealth (INDEPENDENT_AMBULATORY_CARE_PROVIDER_SITE_OTHER): Payer: Self-pay | Admitting: Radiology

## 2008-10-29 ENCOUNTER — Ambulatory Visit: Payer: Self-pay | Admitting: Cardiology

## 2008-11-03 ENCOUNTER — Telehealth (INDEPENDENT_AMBULATORY_CARE_PROVIDER_SITE_OTHER): Payer: Self-pay | Admitting: *Deleted

## 2008-11-04 ENCOUNTER — Ambulatory Visit: Payer: Self-pay

## 2008-11-04 ENCOUNTER — Encounter: Payer: Self-pay | Admitting: Internal Medicine

## 2008-11-04 ENCOUNTER — Ambulatory Visit: Payer: Self-pay | Admitting: Cardiovascular Disease

## 2008-11-05 LAB — CONVERTED CEMR LAB
Basophils Relative: 1.2 % (ref 0.0–3.0)
Eosinophils Absolute: 0.2 10*3/uL (ref 0.0–0.7)
Hemoglobin: 10.4 g/dL — ABNORMAL LOW (ref 12.0–15.0)
Lymphs Abs: 1.6 10*3/uL (ref 0.7–4.0)
MCHC: 32.9 g/dL (ref 30.0–36.0)
MCV: 99.5 fL (ref 78.0–100.0)
Monocytes Absolute: 0.5 10*3/uL (ref 0.1–1.0)
Neutro Abs: 1.8 10*3/uL (ref 1.4–7.7)
Neutrophils Relative %: 46 % (ref 43.0–77.0)
RBC: 3.18 M/uL — ABNORMAL LOW (ref 3.87–5.11)

## 2008-11-23 ENCOUNTER — Ambulatory Visit: Payer: Self-pay | Admitting: Cardiology

## 2008-12-11 ENCOUNTER — Ambulatory Visit (HOSPITAL_COMMUNITY): Admission: RE | Admit: 2008-12-11 | Discharge: 2008-12-11 | Payer: Self-pay | Admitting: Internal Medicine

## 2008-12-21 ENCOUNTER — Ambulatory Visit: Payer: Self-pay | Admitting: Cardiology

## 2009-01-05 ENCOUNTER — Encounter: Payer: Self-pay | Admitting: Cardiovascular Disease

## 2009-01-18 ENCOUNTER — Ambulatory Visit: Payer: Self-pay | Admitting: Cardiology

## 2009-02-01 ENCOUNTER — Ambulatory Visit: Payer: Self-pay | Admitting: Orthopedic Surgery

## 2009-02-09 ENCOUNTER — Ambulatory Visit: Payer: Self-pay | Admitting: Cardiovascular Disease

## 2009-02-22 ENCOUNTER — Ambulatory Visit: Payer: Self-pay | Admitting: Cardiology

## 2009-03-08 ENCOUNTER — Ambulatory Visit: Payer: Self-pay | Admitting: Cardiology

## 2009-03-08 LAB — CONVERTED CEMR LAB: POC INR: 2.9

## 2009-03-29 ENCOUNTER — Ambulatory Visit: Payer: Self-pay | Admitting: Cardiology

## 2009-03-29 LAB — CONVERTED CEMR LAB: POC INR: 1.4

## 2009-04-07 ENCOUNTER — Ambulatory Visit: Payer: Self-pay | Admitting: Cardiology

## 2009-04-07 LAB — CONVERTED CEMR LAB: POC INR: 1.9

## 2009-04-21 ENCOUNTER — Ambulatory Visit: Payer: Self-pay | Admitting: Cardiology

## 2009-04-21 LAB — CONVERTED CEMR LAB: POC INR: 2.8

## 2009-05-19 ENCOUNTER — Ambulatory Visit: Payer: Self-pay | Admitting: Cardiology

## 2009-05-19 LAB — CONVERTED CEMR LAB: POC INR: 2.3

## 2009-05-30 ENCOUNTER — Emergency Department (HOSPITAL_COMMUNITY): Admission: EM | Admit: 2009-05-30 | Discharge: 2009-05-30 | Payer: Self-pay | Admitting: Emergency Medicine

## 2009-06-17 ENCOUNTER — Ambulatory Visit: Payer: Self-pay | Admitting: Cardiology

## 2009-06-29 ENCOUNTER — Ambulatory Visit: Payer: Self-pay | Admitting: Orthopedic Surgery

## 2009-06-29 DIAGNOSIS — M533 Sacrococcygeal disorders, not elsewhere classified: Secondary | ICD-10-CM | POA: Insufficient documentation

## 2009-06-29 DIAGNOSIS — M129 Arthropathy, unspecified: Secondary | ICD-10-CM | POA: Insufficient documentation

## 2009-06-29 DIAGNOSIS — M4800 Spinal stenosis, site unspecified: Secondary | ICD-10-CM

## 2009-06-30 ENCOUNTER — Ambulatory Visit: Payer: Self-pay

## 2009-06-30 ENCOUNTER — Encounter: Payer: Self-pay | Admitting: Cardiovascular Disease

## 2009-06-30 DIAGNOSIS — I6529 Occlusion and stenosis of unspecified carotid artery: Secondary | ICD-10-CM

## 2009-07-02 ENCOUNTER — Telehealth: Payer: Self-pay | Admitting: Orthopedic Surgery

## 2009-07-05 ENCOUNTER — Telehealth: Payer: Self-pay | Admitting: Orthopedic Surgery

## 2009-07-06 ENCOUNTER — Telehealth: Payer: Self-pay | Admitting: Orthopedic Surgery

## 2009-07-08 ENCOUNTER — Encounter: Payer: Self-pay | Admitting: Cardiovascular Disease

## 2009-07-08 ENCOUNTER — Telehealth: Payer: Self-pay | Admitting: Cardiovascular Disease

## 2009-07-13 ENCOUNTER — Telehealth: Payer: Self-pay | Admitting: Orthopedic Surgery

## 2009-07-15 ENCOUNTER — Ambulatory Visit: Payer: Self-pay | Admitting: Cardiology

## 2009-07-27 ENCOUNTER — Telehealth: Payer: Self-pay | Admitting: Orthopedic Surgery

## 2009-07-30 ENCOUNTER — Ambulatory Visit: Payer: Self-pay | Admitting: Cardiovascular Disease

## 2009-07-30 ENCOUNTER — Telehealth (INDEPENDENT_AMBULATORY_CARE_PROVIDER_SITE_OTHER): Payer: Self-pay | Admitting: *Deleted

## 2009-07-30 ENCOUNTER — Encounter: Payer: Self-pay | Admitting: Cardiology

## 2009-09-10 IMAGING — CR DG LUMBAR SPINE COMPLETE 4+V
5 series · 5 of 5 positions shown · non-contrast
Comparison: none

CLINICAL DATA: 82 year-old with low back pain.
 LUMBAR SPINE ? 5 VIEW:

[view not recorded (1 of 5)]
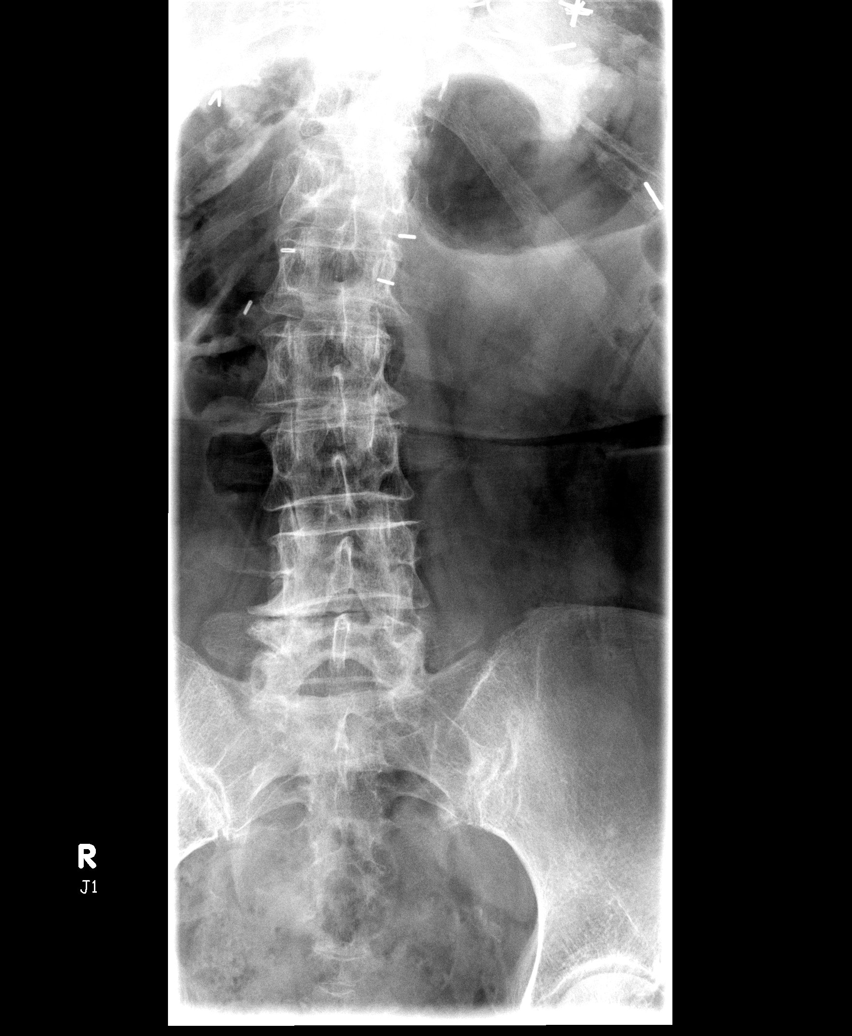

[view not recorded (2 of 5)]
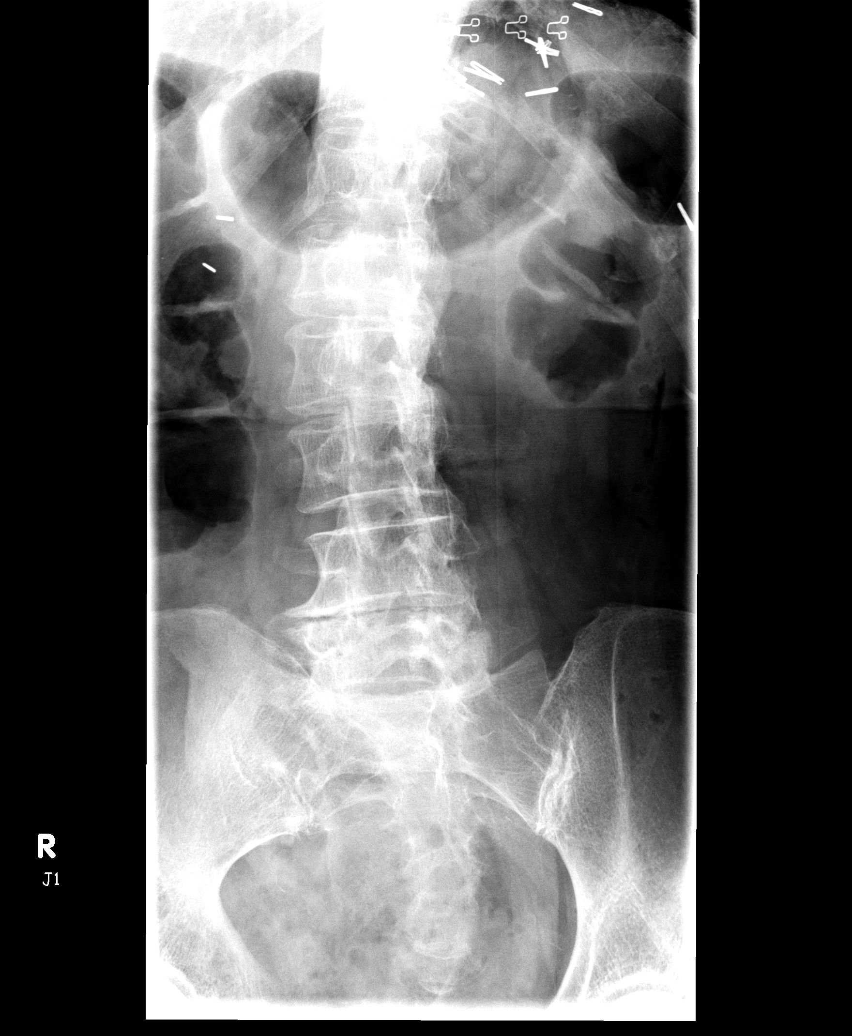

[view not recorded (3 of 5)]
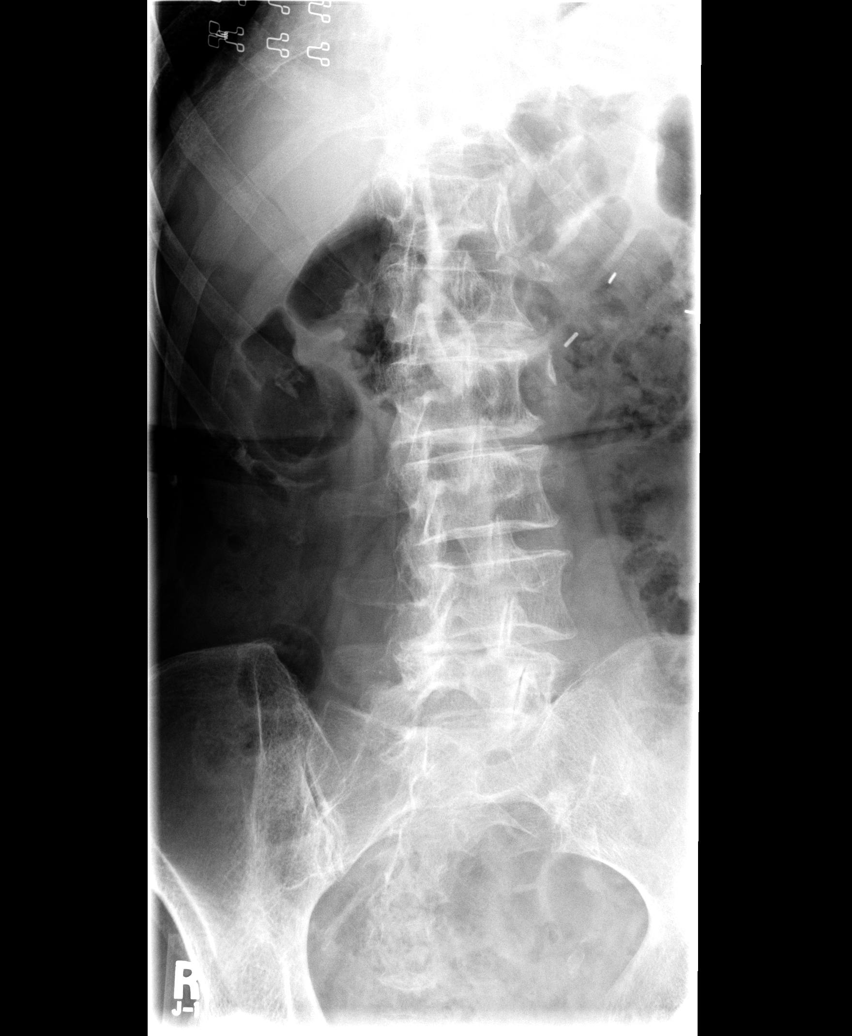

[view not recorded (4 of 5)]
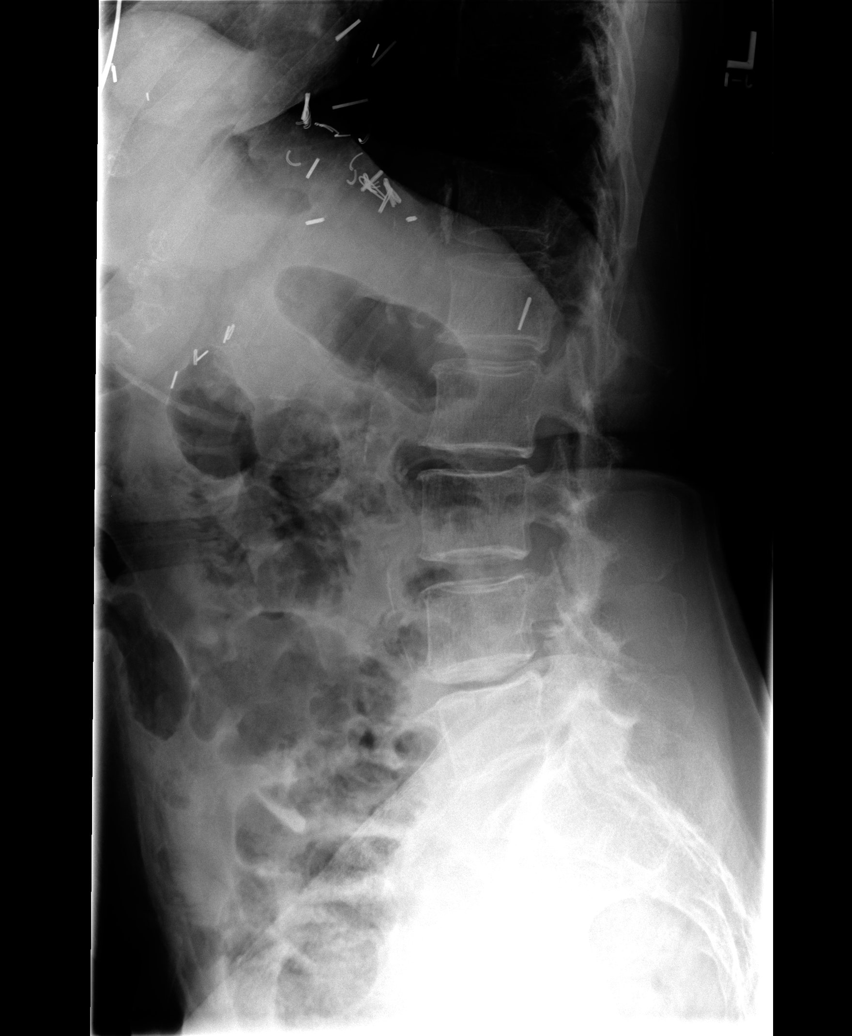

[view not recorded (5 of 5)]
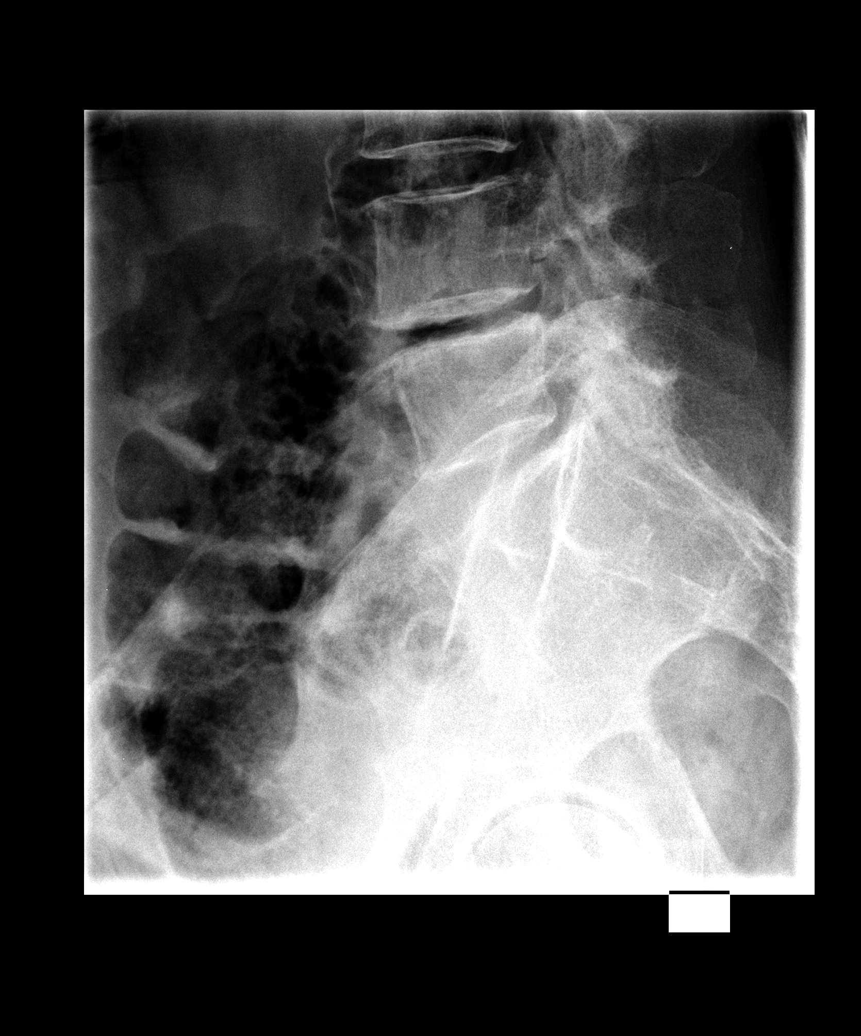

[5 of 5 positions shown; findings below may reference images not displayed]

FINDINGS: Lateral film demonstrates normal alignment of the lumbar vertebral bodies.  There is degenerative disk disease at L4-5 with disk space narrowing.  No acute bony findings.   Moderate facet disease but no pars defects. Visualized bony pelvis is intact.
IMPRESSION: 1.  Normal alignment. No acute bony findings.
 2.  Degenerative disk disease at L4-5 and degenerative facet disease in the lower lumbar spine.

## 2009-09-23 ENCOUNTER — Encounter: Payer: Self-pay | Admitting: Orthopedic Surgery

## 2009-12-14 ENCOUNTER — Ambulatory Visit (HOSPITAL_COMMUNITY): Admission: RE | Admit: 2009-12-14 | Discharge: 2009-12-14 | Payer: Self-pay | Admitting: Internal Medicine

## 2009-12-29 ENCOUNTER — Ambulatory Visit (HOSPITAL_COMMUNITY): Admission: RE | Admit: 2009-12-29 | Discharge: 2009-12-29 | Payer: Self-pay | Admitting: Internal Medicine

## 2010-01-24 ENCOUNTER — Telehealth: Payer: Self-pay | Admitting: Cardiovascular Disease

## 2010-01-24 ENCOUNTER — Ambulatory Visit: Payer: Self-pay | Admitting: Cardiovascular Disease

## 2010-01-24 ENCOUNTER — Encounter: Payer: Self-pay | Admitting: Cardiovascular Disease

## 2010-01-24 DIAGNOSIS — R51 Headache: Secondary | ICD-10-CM

## 2010-01-24 DIAGNOSIS — R42 Dizziness and giddiness: Secondary | ICD-10-CM

## 2010-01-24 DIAGNOSIS — R519 Headache, unspecified: Secondary | ICD-10-CM | POA: Insufficient documentation

## 2010-01-25 ENCOUNTER — Telehealth: Payer: Self-pay | Admitting: Cardiovascular Disease

## 2010-03-20 ENCOUNTER — Encounter: Payer: Self-pay | Admitting: Cardiovascular Disease

## 2010-03-23 ENCOUNTER — Ambulatory Visit
Admission: RE | Admit: 2010-03-23 | Discharge: 2010-03-23 | Payer: Self-pay | Source: Home / Self Care | Attending: Orthopedic Surgery | Admitting: Orthopedic Surgery

## 2010-03-27 LAB — CONVERTED CEMR LAB
ALT: 27 units/L (ref 0–35)
AST: 44 units/L — ABNORMAL HIGH (ref 0–37)
Alkaline Phosphatase: 78 units/L (ref 39–117)
Alpha-1-Globulin: 4.3 % (ref 2.9–4.9)
Anti Nuclear Antibody(ANA): NEGATIVE
BUN: 20 mg/dL (ref 6–23)
BUN: 55 mg/dL — ABNORMAL HIGH (ref 6–23)
Basophils Relative: 0.3 % (ref 0.0–3.0)
CRP: 0.2 mg/dL (ref <0.6)
Chloride: 108 meq/L (ref 96–112)
Eosinophils Absolute: 0 10*3/uL (ref 0.0–0.7)
Eosinophils Absolute: 0.1 10*3/uL — ABNORMAL LOW (ref 0.2–0.7)
Eosinophils Relative: 0.3 % (ref 0.0–5.0)
Eosinophils Relative: 2 % (ref 0–5)
Eosinophils Relative: 3 % (ref 0–5)
Gamma Globulin: 16 % (ref 11.1–18.8)
Glucose, Bld: 105 mg/dL — ABNORMAL HIGH (ref 70–99)
Glucose, Bld: 108 mg/dL — ABNORMAL HIGH (ref 70–99)
HCT: 38.3 % (ref 36.0–46.0)
HCT: 42.2 % (ref 36.0–46.0)
Hemoglobin: 11.4 g/dL — ABNORMAL LOW (ref 12.0–15.0)
IgA: 139 mg/dL (ref 68–378)
IgG (Immunoglobin G), Serum: 1300 mg/dL (ref 694–1618)
IgM, Serum: 98 mg/dL (ref 60–263)
Lymphocytes Relative: 18 % (ref 12–46)
Lymphocytes Relative: 21 % (ref 12.0–46.0)
Lymphs Abs: 0.8 10*3/uL (ref 0.7–4.0)
Lymphs Abs: 1.1 10*3/uL (ref 0.7–4.0)
MCV: 99.5 fL (ref 78.0–100.0)
Monocytes Absolute: 0.4 10*3/uL (ref 0.1–1.0)
Monocytes Relative: 13.2 % — ABNORMAL HIGH (ref 3.0–12.0)
Monocytes Relative: 7 % (ref 3–12)
Monocytes Relative: 8 % (ref 3–12)
Neutro Abs: 4.7 10*3/uL (ref 1.7–7.7)
Neutrophils Relative %: 65.2 % (ref 43.0–77.0)
Platelets: 223 10*3/uL (ref 150–400)
Potassium: 7 meq/L (ref 3.5–5.1)
RBC: 3.45 M/uL — ABNORMAL LOW (ref 3.87–5.11)
RBC: 3.61 M/uL — ABNORMAL LOW (ref 3.87–5.11)
Rhuematoid fact SerPl-aCnc: <20 intl units/mL (ref 0–20)
TSH: 0.71 microintl units/mL (ref 0.35–5.50)
Total Bilirubin: 0.3 mg/dL (ref 0.3–1.2)
Total Bilirubin: 1 mg/dL (ref 0.3–1.2)
WBC: 5.4 10*3/uL (ref 4.0–10.5)
WBC: 7.1 10*3/uL (ref 4.5–10.5)

## 2010-03-31 NOTE — Progress Notes (Signed)
Summary: Patient + daughter call + request  ---- Converted from flag ---- ---- 07/12/2009 6:22 PM, Cammie Sickle wrote: I left a message for patient's daughter per Dr Mort Sawyers reply.  ---- 07/12/2009 11:22 AM, Fuller Canada MD wrote: 07/13/09 - patient's daughter [name] returned the call. advised per previous note re: Mri of 2009.  Patient has already spoken w/her PCP to try to have a new MRI ordered of her back, due to the "knot"/painful raised area on her back which she strongly feels she needs to have the MRI.  She will most likely hold on the first injection sched'd w/Dr Eduard Clos for MRI until further notice re: MRI. In addition, report from Dr Ozzie Hoyle consult (07/08/09) has been requested.  the mri she had in 2009 is still good   spinal stenosis : referred to neurosurgery   ---- 07/08/2009 3:34 PM, Cammie Sickle wrote: [name]'s (DOB 2023/04/15) daughter [name] called to  (1) relay that Dr Eduard Clos is planning to do "injection into a facet joint", for which mom will need to be off Coumadin X6 days prior. She said Dr Eduard Clos does not recommend epidural injections. Their question is "what about the spinal stenosis?"   (We've ref'd to neurosurg also, referral has been faxed to [facility]) (2) she asked again about MRI, which we've answered per your last note "no MRI She's sched'd back here 07/28/09 - advised we'd further discuss+we should have Dr Ozzie Hoyle rpt by this visit. ------------------------------

## 2010-03-31 NOTE — Progress Notes (Signed)
Summary: Neurosurgeon appointment  Phone Note Outgoing Call   Call placed by: Waldon Reining,  Jul 27, 2009 11:58 AM Call placed to: Patient Action Taken: Appt scheduled Summary of Call: I called to give the patient her appointment with Dr. Channing Mutters on 09-15-09 at 11:00. Patient is aware to take her films.

## 2010-03-31 NOTE — Progress Notes (Signed)
Summary: steroid injection/stop coumadin  Phone Note Call from Patient Call back at Home Phone [phone]   Caller: Patient Reason for Call: Talk to Nurse Summary of Call: pt saw Dr Nickola Major today and needs steroid injection, needs to stop coumadin for 6 days prior... which she would need to stop today, request we call Dr Eduard Clos ofc today [phone] Initial call taken by: Migdalia Dk,  Jul 08, 2009 3:48 PM  Follow-up for Phone Call        I spoke with Dr [name] by phone and a verbal order was given that the pt can hold coumadin prior to steroid injection.  Form was faxed to Dr Toni Amend office.  I spoke with the pt and made her aware that she can hold coumadin for injection.  The pt's injection is currently not scheduled. I did explain the reason why the pt would need to hold coumadin prior to the injection.  Follow-up by: Julieta Gutting, RN, BSN,  Jul 08, 2009 6:51 PM

## 2010-03-31 NOTE — Medication Information (Signed)
Summary: ccr-lr  Anticoagulant Therapy  Managed by: Haley Hey, RN Referring MD: M.Cooper PCP: Franchot Heidelberg, MD Supervising MD: Diona Browner MD, Remi Deter Indication 1: Atrial Fibrillation (ICD-427.31) Lab Used: [facility] [place] Site: [place] INR POC 2.8  Dietary changes: no    Health status changes: no    Bleeding/hemorrhagic complications: no    Recent/future hospitalizations: no    Any changes in medication regimen? no    Recent/future dental: no  Any missed doses?: no       Is patient compliant with meds? yes       Allergies: 1)  ! Codeine 2)  ! * Tramadol  Anticoagulation Management History:      The patient is taking warfarin and comes in today for a routine follow up visit.  Positive risk factors for bleeding include an age of 75 years or older.  Negative risk factors for bleeding include no history of CVA/TIA.  The bleeding index is 'intermediate risk'.  Positive CHADS2 values include History of HTN and Age > 35 years old.  Negative CHADS2 values include History of Diabetes and Prior Stroke/CVA/TIA.  The start date was 09/19/2006.  Anticoagulation responsible provider: Diona Browner MD, Remi Deter.  INR POC: 2.8.  Cuvette Lot#: [id number].  Exp: 02/12.    Anticoagulation Management Assessment/Plan:      The patient's current anticoagulation dose is Warfarin sodium 5 mg  tabs: [facility].  The target INR is 2 - 3.  The next INR is due 05/20/2009.  Anticoagulation instructions were given to patient.  Results were reviewed/authorized by Haley Hey, RN.  She was notified by Haley Hey RN.         Prior Anticoagulation Instructions: INR 1.9 Take coumadin 2 tablets tonight then resume 1 tablet once daily except 1 1/2 tablets on Mondays, Wednesdays and Fridays  Current Anticoagulation Instructions: INR 2.8 Continue coumadin 5mg  once daily except 7.5mg  on Mondays, Wednesdays and Fridays

## 2010-03-31 NOTE — Medication Information (Signed)
Summary: 2 WK PROTIME PER CHECKOUT ON 02/22/09/TG  Anticoagulant Therapy  Managed by: Vashti Hey, RN Referring MD: M.Cooper PCP: Franchot Heidelberg, MD Supervising MD: Daleen Squibb MD, Maisie Fus Indication 1: Atrial Fibrillation (ICD-427.31) Lab Used: [facility] [place] Site: [place] INR POC 2.9  Dietary changes: no    Health status changes: no    Bleeding/hemorrhagic complications: yes       Details: has had blood in stoll but has hemorrhoids  Recent/future hospitalizations: no    Any changes in medication regimen? no    Recent/future dental: no  Any missed doses?: no       Is patient compliant with meds? yes       Allergies: 1)  ! Codeine 2)  ! * Tramadol  Anticoagulation Management History:      The patient is taking warfarin and comes in today for a routine follow up visit.  Positive risk factors for bleeding include an age of 20 years or older.  Negative risk factors for bleeding include no history of CVA/TIA.  The bleeding index is 'intermediate risk'.  Positive CHADS2 values include History of HTN and Age > 16 years old.  Negative CHADS2 values include History of Diabetes and Prior Stroke/CVA/TIA.  The start date was 09/19/2006.  Anticoagulation responsible provider: Daleen Squibb MD, Maisie Fus.  INR POC: 2.9.  Cuvette Lot#: [id number].  Exp: 02/12.    Anticoagulation Management Assessment/Plan:      The patient's current anticoagulation dose is Warfarin sodium 5 mg  tabs: [facility].  The target INR is 2 - 3.  The next INR is due 04/05/2009.  Anticoagulation instructions were given to patient.  Results were reviewed/authorized by Vashti Hey, RN.  She was notified by Vashti Hey RN.         Prior Anticoagulation Instructions: INR 3.1 TODAY HOLD TODAY'S DOSE THEN RESUME CURRENT DOSE OF 1.5 TABLETS ON MONDAYS, WEDNESDAYS, AND FRIDAYS AND 1 TABLET ALL OTHER DAYS  Current Anticoagulation Instructions: INR 2.9 Decrease coumadin to 5mg  once daily except  7.5mg  on Mondays and Fridays

## 2010-03-31 NOTE — Progress Notes (Signed)
Summary: c/o b/p high. dizzy  Phone Note Call from Patient Call back at Home Phone [phone]   Caller: Patient Reason for Call: Talk to Nurse Details for Reason: c/o b/p is high when she went to see her pcp.  dizzy. b/p today 145/72. pulse  89. should pt come into today to see mc or should a rx be prescribe.  Initial call taken by: Lorne Skeens,  January 24, 2010 9:35 AM  Follow-up for Phone Call        I spoke with the pt and she c/o high BP and dizziness for a couple of weeks.  The pt said her SBP has been as high as 201.  The pt's grandson instructed her to take one of her husband's BP meds when BP was that high.  The pt c/o dizziness when she bends over.  The pt has a Hx of Vertigo and her symptoms are not similar to vertigo.  I arranged an appt for the pt to be seen today by Dr [name] at 3:45.   Follow-up by: Julieta Gutting, RN, BSN,  January 24, 2010 9:55 AM

## 2010-03-31 NOTE — Medication Information (Signed)
Summary: ccr-lr  Anticoagulant Therapy  Managed by: Vashti Hey, RN Referring MD: M.Cooper PCP: Franchot Heidelberg, MD Supervising MD: Dietrich Pates MD, Molly Maduro Indication 1: Atrial Fibrillation (ICD-427.31) Lab Used: [facility] [place] Site: [place] INR POC 1.4  Dietary changes: no    Health status changes: no    Bleeding/hemorrhagic complications: yes       Details: gums bleeding x 2 night when brushing teeth  Recent/future hospitalizations: no    Any changes in medication regimen? no    Recent/future dental: no  Any missed doses?: no       Is patient compliant with meds? yes       Allergies: 1)  ! Codeine 2)  ! * Tramadol  Anticoagulation Management History:      The patient is taking warfarin and comes in today for a routine follow up visit.  Positive risk factors for bleeding include an age of 16 years or older.  Negative risk factors for bleeding include no history of CVA/TIA.  The bleeding index is 'intermediate risk'.  Positive CHADS2 values include History of HTN and Age > 33 years old.  Negative CHADS2 values include History of Diabetes and Prior Stroke/CVA/TIA.  The start date was 09/19/2006.  Anticoagulation responsible provider: Dietrich Pates MD, Molly Maduro.  INR POC: 1.4.  Cuvette Lot#: [id number].  Exp: 02/12.    Anticoagulation Management Assessment/Plan:      The patient's current anticoagulation dose is Warfarin sodium 5 mg  tabs: [facility].  The target INR is 2 - 3.  The next INR is due 04/07/2009.  Anticoagulation instructions were given to patient.  Results were reviewed/authorized by Vashti Hey, RN.  She was notified by Vashti Hey RN.         Prior Anticoagulation Instructions: INR 2.9 Decrease coumadin to 5mg  once daily except 7.5mg  on Mondays and Fridays  Current Anticoagulation Instructions: INR 1.4 Take coumadin 2 tablets tonight, 1 1/2 tablets tomorrow night then increase dose to 1 tablet once daily except 1 1/2 tablets  on Mondays, Wednesdays and Fridays

## 2010-03-31 NOTE — Progress Notes (Signed)
  Phone Note Outgoing Call   Call placed by: Lisabeth Devoid RN,  January 25, 2010 9:26 AM Call placed to: Patient Summary of Call: blood pressure medication  Follow-up for Phone Call        Mrs. Kiker calls today regarding blood pressure medication.  She took her husband's Metoprolol Tartrate 50mg  over the weekend for her bp.  In reviewing her list of medications she stated that she had not been taking the Metoprolol Succinate which had been prescribed on 02/09/09.  She did not pick it up from the pharmacy for some reason.  Dr. [name] is aware and pt will start today taking Metoprolol Succinate 50 mg at bedtime.  Pt is aware and medication called in. Mylo Red RN    New/Updated Medications: METOPROLOL SUCCINATE 50 MG XR24H-TAB (METOPROLOL SUCCINATE) Take one tablet by mouth at bedtime. Start tonight. Prescriptions: METOPROLOL SUCCINATE 50 MG XR24H-TAB (METOPROLOL SUCCINATE) Take one tablet by mouth at bedtime. Start tonight.  #30 x 11   Entered by:   Lisabeth Devoid RN   Authorized by:   Norva Karvonen, MD   Signed by:   Lisabeth Devoid RN on 01/25/2010   Method used:   Electronically to        [facility] (retail)       [address]       [place], [place]  [address]       Ph: [phone]       Fax: [phone]   RxID:   [id number]

## 2010-03-31 NOTE — Medication Information (Signed)
Summary: [facility] Clinic  Anticoagulant Therapy  Managed by: Inactive Referring MD: M.Cooper PCP: Dr. Dwana Melena Supervising MD: Diona Browner MD, Remi Deter Indication 1: Atrial Fibrillation (ICD-427.31) Lab Used: [facility] [place] Site: [place]          Comments: Coumadin discontinued per Dr [name] and pt was started on ASA 325mg  once daily due to 4 recent falls and pt remaining in NSR.  Allergies: 1)  ! Codeine 2)  ! * Tramadol 3)  ! * Tramadol  Anticoagulation Management History:      Positive risk factors for bleeding include an age of 75 years or older.  Negative risk factors for bleeding include no history of CVA/TIA.  The bleeding index is 'intermediate risk'.  Positive CHADS2 values include History of HTN and Age > 75 years old.  Negative CHADS2 values include History of Diabetes and Prior Stroke/CVA/TIA.  The start date was 09/19/2006.  Anticoagulation responsible provider: Diona Browner MD, Remi Deter.  Exp: 02/12.    Anticoagulation Management Assessment/Plan:      The target INR is 2 - 3.  The next INR is due 08/12/2009.  Anticoagulation instructions were given to patient.  Results were reviewed/authorized by Inactive.         Prior Anticoagulation Instructions: INR 3.1 Take coumadin 1/2 tablet tonight then resume 1 tablet once daily except 1.5 tablets on Mondays, Wednesdays and Fridays

## 2010-03-31 NOTE — Progress Notes (Signed)
Summary: call from patient,has appt scheduled w/Dr Eduard Clos + has questions  Phone Note Call from Patient   Caller: Patient Summary of Call: Patient called this morning re: (1) pain in her low back is worse since her visit (2) to notify of her referral appt for Dr Eduard Clos Magdalene Molly, 07/08/09 at 9:00am (we have just rec'd a notice of the appointment also) (3) to ask if Dr Romeo Apple feels she should have the MRI prior to the appointment w/Dr Eduard Clos Patient's ph # is [phone] Initial call taken by: Cammie Sickle,  Jul 05, 2009 11:35 AM  Follow-up for Phone Call        no mri  Follow-up by: Fuller Canada MD,  Jul 05, 2009 12:01 PM

## 2010-03-31 NOTE — Assessment & Plan Note (Signed)
Summary: request inj in both knees/bsf   Visit Type:  Follow-up Referring Provider:  Dr. Dwana Melena Primary Provider:  Dr. Dwana Melena  CC:  BILATERAL KNEE OA.  History of Present Illness: I saw [name] in the office today for a followup visit.  She is a 77 years old woman with the complaint of:  Right knee pain, OA.  Requesting injection today.  Last injection 06/29/2009, helped alot.  Tylenol and Celebrex for OA.  Dr. Channing Mutters is Neurosurgeon, has had ESI's also.          Allergies: 1)  ! Codeine 2)  ! * Tramadol   Impression & Recommendations:  Problem # 1:  ARTHRITIS, KNEES, BILATERAL (ICD-716.98)  Verbal consent was obtained. The RIGHT knee was prepped with alcohol and ethyl chloride. 1 cc of depomedrol 40mg /cc and 4 cc of lidocaine 1% was injected. there were no complications.  Orders: Joint Aspirate / Injection, Large (20610) Depo- Medrol 40mg  (J1030)  Patient Instructions: 1)  You have received an injection of cortisone today. You may experience increased pain at the injection site. Apply ice pack to the area for 20 minutes every 2 hours and take 2 xtra strength tylenol every 8 hours. This increased pain will usually resolve in 24 hours. The injection will take effect in 3-10 days.    Orders Added: 1)  Joint Aspirate / Injection, Large [20610] 2)  Depo- Medrol 40mg  [J1030]

## 2010-03-31 NOTE — Letter (Signed)
Summary: [facility] Medical Clearance   [facility] Medical Clearance   Imported By: Roderic Ovens 09/01/2009 14:19:32  _____________________________________________________________________  External Attachment:    Type:   Image     Comment:   External Document

## 2010-03-31 NOTE — Progress Notes (Signed)
Summary: Appointment with Dr. Eduard Clos.  Phone Note From Other Clinic   Caller: Referral Coordinator Reason for Call: Schedule Patient Appt Summary of Call: Patient has an appointment with Dr. Eduard Clos on 07-08-09 at 9:00. Initial call taken by: Waldon Reining,  Jul 06, 2009 8:03 AM

## 2010-03-31 NOTE — Progress Notes (Signed)
Summary: Referral to Dr. Eduard Clos.  Phone Note Outgoing Call   Call placed by: Waldon Reining,  Jul 02, 2009 10:20 AM Call placed to: Specialist Action Taken: Information Sent Summary of Call: I faxed a referral for this patient to Dr. Eduard Clos for ESI injections.

## 2010-03-31 NOTE — Progress Notes (Signed)
Summary: wants rx for Vicodin  Phone Note Call from Patient Call back at Home Phone [phone]   Summary of Call: wants to know if you will call in Vicodin for her, says she took husband's Vicodin and it helped, uses [facility] , wants delivery, ok or not to call vicodin in, advised may be Monday before she gets a response. Initial call taken by: Ether Griffins,  Jul 02, 2009 10:02 AM  Follow-up for Phone Call        ok called in Follow-up by: Chasity Tereasa Coop,  Jul 05, 2009 10:05 AM    New/Updated Medications: VICODIN 5-500 MG TABS (HYDROCODONE-ACETAMINOPHEN) one every 6 hours p.r.n. pain do not exceed 4 tablets in 24 hours Prescriptions: VICODIN 5-500 MG TABS (HYDROCODONE-ACETAMINOPHEN) one every 6 hours p.r.n. pain do not exceed 4 tablets in 24 hours  #60 x 5   Entered and Authorized by:   Fuller Canada MD   Signed by:   Fuller Canada MD on 07/05/2009   Method used:   Telephoned to ...       [facility] (retail)       [address]       [place], [place]  [address]       Ph: [phone]       Fax: [phone]   RxID:   [id number]

## 2010-03-31 NOTE — Medication Information (Signed)
Summary: ccr-lr  Anticoagulant Therapy  Managed by: Vashti Hey, RN Referring MD: M.Cooper PCP: Franchot Heidelberg, MD Supervising MD: Dietrich Pates MD, Molly Maduro Indication 1: Atrial Fibrillation (ICD-427.31) Lab Used: [facility] [place] Site: [place] INR POC 1.9  Dietary changes: no    Health status changes: no    Bleeding/hemorrhagic complications: no    Recent/future hospitalizations: no    Any changes in medication regimen? no    Recent/future dental: no  Any missed doses?: no       Is patient compliant with meds? yes       Allergies: 1)  ! Codeine 2)  ! * Tramadol  Anticoagulation Management History:      The patient is taking warfarin and comes in today for a routine follow up visit.  Positive risk factors for bleeding include an age of 75 years or older.  Negative risk factors for bleeding include no history of CVA/TIA.  The bleeding index is 'intermediate risk'.  Positive CHADS2 values include History of HTN and Age > 72 years old.  Negative CHADS2 values include History of Diabetes and Prior Stroke/CVA/TIA.  The start date was 09/19/2006.  Anticoagulation responsible provider: Dietrich Pates MD, Molly Maduro.  INR POC: 1.9.  Cuvette Lot#: [id number].  Exp: 02/12.    Anticoagulation Management Assessment/Plan:      The patient's current anticoagulation dose is Warfarin sodium 5 mg  tabs: [facility].  The target INR is 2 - 3.  The next INR is due 04/21/2009.  Anticoagulation instructions were given to patient.  Results were reviewed/authorized by Vashti Hey, RN.  She was notified by Vashti Hey RN.         Prior Anticoagulation Instructions: INR 1.4 Take coumadin 2 tablets tonight, 1 1/2 tablets tomorrow night then increase dose to 1 tablet once daily except 1 1/2 tablets on Mondays, Wednesdays and Fridays  Current Anticoagulation Instructions: INR 1.9 Take coumadin 2 tablets tonight then resume 1 tablet once daily except 1 1/2 tablets on Mondays,  Wednesdays and Fridays

## 2010-03-31 NOTE — Medication Information (Signed)
Summary: ccr-lr  Anticoagulant Therapy  Managed by: Vashti Hey, RN Referring MD: M.Cooper PCP: Franchot Heidelberg, MD Supervising MD: Diona Browner MD, Remi Deter Indication 1: Atrial Fibrillation (ICD-427.31) Lab Used: [facility] [place] Site:  INR POC 2.3  Dietary changes: no    Health status changes: no    Bleeding/hemorrhagic complications: no    Recent/future hospitalizations: no    Any changes in medication regimen? no    Recent/future dental: no  Any missed doses?: no       Is patient compliant with meds? yes       Allergies: 1)  ! Codeine 2)  ! * Tramadol  Anticoagulation Management History:      The patient is taking warfarin and comes in today for a routine follow up visit.  Positive risk factors for bleeding include an age of 75 years or older.  Negative risk factors for bleeding include no history of CVA/TIA.  The bleeding index is 'intermediate risk'.  Positive CHADS2 values include History of HTN and Age > 60 years old.  Negative CHADS2 values include History of Diabetes and Prior Stroke/CVA/TIA.  The start date was 09/19/2006.  Anticoagulation responsible provider: Diona Browner MD, Remi Deter.  INR POC: 2.3.  Cuvette Lot#: [id number].  Exp: 02/12.    Anticoagulation Management Assessment/Plan:      The patient's current anticoagulation dose is Warfarin sodium 5 mg  tabs: [facility].  The target INR is 2 - 3.  The next INR is due 06/17/2009.  Anticoagulation instructions were given to patient.  Results were reviewed/authorized by Vashti Hey, RN.  She was notified by Vashti Hey RN.         Prior Anticoagulation Instructions: INR 2.8 Continue coumadin 5mg  once daily except 7.5mg  on Mondays, Wednesdays and Fridays  Current Anticoagulation Instructions: INR 2.3 COntinue coumadin 5mg  once daily except 7.5mg  on Mondays, Wednesdays and Fridays

## 2010-03-31 NOTE — Consult Note (Signed)
Summary: Consult notes from Dr. Trey Sailors  Consult notes from Dr. Trey Sailors   Imported By: Jacklynn Ganong 03/17/2010 09:01:32  _____________________________________________________________________  External Attachment:    Type:   Image     Comment:   External Document

## 2010-03-31 NOTE — Assessment & Plan Note (Signed)
Summary: RE-CK RT KNEE S/P FALL/NEED XRAYS/MEDICARE/CAF   Visit Type:  Follow-up Referring Provider:  Dr. Dwana Melena Primary Provider:  Dr. Dwana Melena  CC:  bilateral knee pain.Marland Kitchen  History of Present Illness: I saw Aunesty Raybon in the office today for a followup visit.  She is a 77 years old woman with the complaint of:  right knee pain and left knee pain after falling.  she fell onto her knee on March 27 and then again on April 3.  She had another fall on April 19 of April 22.  She is having multiple falls with no apparent cause.  She has a history of treatment for some difficulty with her back many years ago and had an MRI April of 2009 and at that time she was noted to have multilevel lumbar disc disease with spondylosis facet arthrosis and several levels of mild to moderate foraminal narrowing.  She eventually got better or presents back now complaining of bilateral knee pain relief ice intra-articular injection.  MRI 2009 [facility]   Xray of the L spine  06/14/5007.  she does not get relief with 2 Tylenol at night.  Her legs were just suddenly give out from underneath her with no apparent cause.  She has not been worked up for this.  Red Flags: Denies  numbness; tingling, bowel /bladder problems, fever night sweats     Allergies: 1)  ! Codeine 2)  ! * Tramadol  Physical Exam  Additional Exam:   *GEN: appearance was normal /She has a medium to small frame  ** CDV: normal pulses temperature and no edema  * LYMPH nodes were normal   * SKIN was normal   * Neuro: normal sensation, reflexes are normal.  Coordination is normal. ** Psyche: AAO x 3 and mood was normal   MSK *Gait was normal  *Inspection no effusions in either knee.  Mild tenderness in each knee along the medial joint line.  Range of motion is full.  Strength is normal in her eye and her feet bilaterally.  Knees are stable.  She does have tenderness in the LEFT SI joint and tenderness in the lower  back.      Impression & Recommendations:  Problem # 1:  KNEE PAIN (ICD-719.46) Assessment Deteriorated  RIGHT and LEFT knee injection with the following procedure Verbal consent was obtained. The knee was prepped with alcohol and ethyl chloride. 1 cc of depomedrol 40mg /cc and 4 cc of lidocaine 1% was injected. there were no complications.   Her updated medication list for this problem includes:    Flexeril 10 Mg Tabs (Cyclobenzaprine hcl) .Marland Kitchen... 1/2 at bedtime    Tylenol 325 Mg Tabs (Acetaminophen) .Marland Kitchen... As needed  Problem # 2:  SPINAL STENOSIS (ICD-724.00) Assessment: Deteriorated  Recommend epidural steroid injection at L4 and 5, referral to [facility] for consideration for surgical decompression  Orders: Est. Patient Level IV (16109) Depo- Medrol 40mg  (J1030) Joint Aspirate / Injection, Large (20610)  Problem # 3:  SACROILIAC JOINT DYSFUNCTION (ICD-724.6) Assessment: New  Orders: Est. Patient Level IV (60454) Depo- Medrol 40mg  (J1030) Joint Aspirate / Injection, Large (20610)  Problem # 4:  ARTHRITIS, KNEES, BILATERAL (ICD-716.98) Assessment: Comment Only  Orders: Est. Patient Level IV (09811) Depo- Medrol 40mg  (J1030) Joint Aspirate / Injection, Large (20610) Knee x-ray,  3 views (91478) bilateral knee examination reveals no acute injury mild degenerative changes with minimal joint space narrowing  Other Orders: Neurosurgeon Referral Psychologist, educational) Neurosurgeon Referral (Neurosurgeon) Misc. Referral (Misc. Ref)  Patient Instructions: 1)  ESI L4-5 and [facility] referral 2)  return in 4 weeks

## 2010-03-31 NOTE — Assessment & Plan Note (Signed)
Summary: dizziness and high BP   Visit Type:  Follow-up Referring Yesmin Mutch:  Dr. Dwana Melena Primary Trayton Szabo:  Dr. Dwana Melena  CC:  dizziness and high blood pressure.  History of Present Illness: This is an 77 year old woman with paroxysmal atrial fibrillation and coronary artery disease status post CABG, presenting for followup evaluation.  She called in today complaining of dizziness and elevated BP.   She complains of dizziness over the past few weeks. She also notes elevation of her blood pressures recently. She denies a sensation of vertigo, but feels very unsteady with standing. She has had inner ear problems in the past but this feels different. When she stands up she needs to hold on to something to steady herself. She denies arm or leg weakness, numbness, or tingling. She denies speech problems. She denies diplopia or  blurry vision. She states 'I just feel crazy.' No syncope or near-syncope.   Current Medications (verified): 1)  Prilosec Otc 20 Mg  Tbec (Omeprazole Magnesium) .... As Needed 2)  Calcium 600/vitamin D 600-200 Mg-Unit  Tabs (Calcium Carbonate-Vitamin D) .... Take 1 Tablet By Mouth Once A Day 3)  Flexeril 10 Mg  Tabs (Cyclobenzaprine Hcl) .... 1/2 At Bedtime 4)  Levothyroxine Sodium 88 Mcg Tabs (Levothyroxine Sodium) .... One Daily 5)  Cvs Senna-C 8.6 Mg Tabs (Sennosides) .... Take As Needed 6)  Tylenol 325 Mg Tabs (Acetaminophen) .... As Needed 7)  Vitamin E 1000 Unit Caps (Vitamin E) .... Take 1 Capsule By Mouth Once A Day 8)  Vitamin D3 1000 Unit Tabs (Cholecalciferol) .... Take 1 Tablet By Mouth Once A Day 9)  Biotin Cap .... Take 1 Capsule By Mouth Once A Day 10)  Metoprolol Succinate 25 Mg Xr24h-Tab (Metoprolol Succinate) .... Take One Tablet By Mouth Daily 11)  Aspirin 325 Mg Tabs (Aspirin) .... Take 1 Tablet Daily 12)  Citalopram Hydrobromide 20 Mg Tabs (Citalopram Hydrobromide) .... Take 1 Tablet By Mouth Once A Day 13)  Msm 1000 Mg Caps  (Methylsulfonylmethane) .... 2000mg  Daily 14)  Xanax 1 Mg Tabs (Alprazolam) .Marland Kitchen.. 1 1/2 Tablet Daily 15)  Voltaren 1 % Gel (Diclofenac Sodium) .... Use Two Times A Day 16)  Celebrex 200 Mg Caps (Celecoxib) .... Take 1 Capsule By Mouth Once A Day  Allergies: 1)  ! Codeine 2)  ! * Tramadol  Past History:  Past medical history reviewed for relevance to current acute and chronic problems.  Past Medical History: Reviewed history from 07/29/2009 and no changes required. Current Problems:  CAD (ICD-414.00) CAROTID ARTERY DISEASE (ICD-433.10) FIBRILLATION, ATRIAL (ICD-427.31) MYOCARDIAL INFARCTION, HX OF (ICD-412) HYPERTENSION (ICD-401) HYPERLIPIDEMIA (ICD-272.4) ARTHRITIS, KNEES, BILATERAL (ICD-716.98) SACROILIAC JOINT DYSFUNCTION (ICD-724.6) SPINAL STENOSIS (ICD-724.00) ANEMIA, IRON DEFICIENCY (ICD-280.9) HYPERCALCEMIA (ICD-275.42) FOOT, ARTHRITIS, DEGEN./OSTEO (ICD-715.97) TENDINITIS, TIBIALIS (ICD-726.72) BENIGN POSITIONAL VERTIGO (ICD-386.11) SINUSITIS, ACUTE (ICD-461.9) PRERENAL AZOTEMIA, MILD (ICD-790.6) MACROCYTIC ANEMIA (ICD-281.9) NAUSEA WITH VOMITING (ICD-787.01) MALAISE AND FATIGUE (ICD-780.79) ABRASION, FOOT (ICD-917.0) RAYNAUDS SYNDROME (ICD-443.0) LIVER FUNCTION TESTS, ABNORMAL (ICD-794.8) ELECTROCARDIOGRAM, ABNORMAL (ICD-794.31) KNEE PAIN (ICD-719.46) IMPINGEMENT SYNDROME (ICD-726.2) UNSPECIFIED OSTEOPOROSIS (ICD-733.00) SHOULDER PAIN, BILATERAL (ICD-719.41) LOW BACK PAIN, CHRONIC (ICD-724.2) COUMADIN THERAPY (ICD-V58.61) ISCHEMIC COLITIS (ICD-557.9) DEGENERATIVE JOINT DISEASE (ICD-715.90) HYPOTHYROIDISM NOS (ICD-244.9) LOW BACK PAIN, CHRONIC (ICD-724.2) ARTHRITIS (ICD-716.90) CONSTIPATION (ICD-564.00) PUD (ICD-533.90) DEPRESSION (ICD-311) ANXIETY (ICD-300.00) ALLERGIC RHINITIS (ICD-477.9)  Review of Systems       Negative except as per HPI   Vital Signs:  Patient profile:   77 year old female Height:      67 inches Weight:  135.50  pounds BMI:     21.30 Pulse rate:   57 / minute Pulse (ortho):   79 / minute Pulse rhythm:   regular Resp:     18 per minute BP standing:   147 / 62  Vitals Entered By: Vikki Ports (January 24, 2010 4:04 PM)  Serial Vital Signs/Assessments:  Time      Position  BP       Pulse  Resp  Temp     By 4:19 PM   Lying LA  162/65   55                    Luz Jaramillo 4:20 PM   Sitting   156/63   55                    Luz Jaramillo 4:22 PM   Standing  147/62   79                    Luz Jaramillo 4:24 PM   Standing  149/68   61                    Luz Jaramillo 4:27 PM   Standing  147/64   57                    Luz Jaramillo  Comments: 4:27 PM Lyng No symptoms Sitting-  Standing - Patient states having ' crazy feeling' By: Vikki Ports    Physical Exam  General:  Pt is alert and oriented, elderly woman, in no acute distress. HEENT: normal Neck: normal carotid upstrokes without bruits, JVP normal Lungs: CTA CV: RRR without murmur or gallop Abd: soft, NT, positive BS, no bruit, no organomegaly Ext: trace bilateral pretibial edema. peripheral pulses 2+ and equal     EKG  Procedure date:  01/24/2010  Findings:      Sinus rhythm with rate-variability, HR 69 bpm, no ST-T changes.  Impression & Recommendations:  Problem # 1:  CAD (ICD-414.00) Stable without angina. Continue current medical program.  Her updated medication list for this problem includes:    Metoprolol Succinate 25 Mg Xr24h-tab (Metoprolol succinate) .Marland Kitchen... Take one tablet by mouth daily    Aspirin 325 Mg Tabs (Aspirin) .Marland Kitchen... Take 1 tablet daily  Orders: EKG w/ Interpretation (93000)  Problem # 2:  HYPERTENSION (ICD-401) Notes elevated BP at home. She has been taking her husband's antihypertensive medication which has helped. I asked her to call with the name and dose of this medicine in the morning.  Her updated medication list for this problem includes:    Metoprolol Succinate 25 Mg Xr24h-tab  (Metoprolol succinate) .Marland Kitchen... Take one tablet by mouth daily    Aspirin 325 Mg Tabs (Aspirin) .Marland Kitchen... Take 1 tablet daily  Problem # 3:  HEADACHE (ICD-784.0) Concerned about her constellation of symptoms with headache, dizziness, and gait unsteadiness. She has PAF and is not anticoagulated because of falls. Recommend MRI/MRA of the brain to rule out compressive brain lesion or posterior circulation stroke. Orthostatic vitals do not explain her dizziness.  Her updated medication list for this problem includes:    Tylenol 325 Mg Tabs (Acetaminophen) .Marland Kitchen... As needed    Metoprolol Succinate 25 Mg Xr24h-tab (Metoprolol succinate) .Marland Kitchen... Take one tablet by mouth daily    Aspirin 325 Mg Tabs (Aspirin) .Marland Kitchen... Take 1 tablet daily    Celebrex 200 Mg Caps (Celecoxib) .Marland Kitchen... Take 1 capsule by  mouth once a day  Orders: MRA (MRA) MRI (MRI)  Problem # 4:  FIBRILLATION, ATRIAL (ICD-427.31) Maintaining sinus rhythm on today's evaluation.  Her updated medication list for this problem includes:    Metoprolol Succinate 25 Mg Xr24h-tab (Metoprolol succinate) .Marland Kitchen... Take one tablet by mouth daily    Aspirin 325 Mg Tabs (Aspirin) .Marland Kitchen... Take 1 tablet daily  Patient Instructions: 1)  Your physician recommends that you schedule a follow-up appointment in: 6 months with Dr. [name] 2)  Your physician recommends that you continue on your current medications as directed. Please refer to the Current Medication list given to you today. 3)  MRI/MRA of the brain.  We will call you with the results.  Appended Document: dizziness and high BP Called patient and she is feeling better. Reports dizziness has nearly resolved...only present with bending forward. No headache. She cancelled MRI. Advised to call back if problems and otherwise keep regular followup.

## 2010-03-31 NOTE — Progress Notes (Signed)
Summary: pt took off coumadin  Phone Note Call from Patient Call back at Home Phone [phone]   Caller: pt Reason for Call: Talk to Nurse Summary of Call: dr cooper took pt off coumadin today and she would like Na Waldrip to call her Initial call taken by: Faythe Ghee,  July 30, 2009 1:41 PM  Follow-up for Phone Call        Called pt. Just wanted to let me know Dr [name] took her off coumadin.  See his last office note.  Pt made inactive in [facility]. Follow-up by: Vashti Hey RN,  July 30, 2009 4:35 PM

## 2010-03-31 NOTE — Miscellaneous (Signed)
Summary: Orders Update  Clinical Lists Changes  Problems: Added new problem of CAROTID ARTERY DISEASE (ICD-433.10) Orders: Added new Test order of Carotid Duplex (Carotid Duplex) - Signed 

## 2010-03-31 NOTE — Assessment & Plan Note (Signed)
Summary: f35m   Visit Type:  6 months follow up Referring Provider:  Dr. Dwana Melena Primary Provider:  Dr. Dwana Melena  CC:  No complaints.  History of Present Illness: This is an 77 year old woman with paroxysmal atrial fibrillation and coronary artery disease status post CABG, presenting for followup evaluation.  She has occasional nocturnal palpitations, but really has no other cardiac complaints. She denies CP, dyspnea, edema, orthopnea, or PND.  She has fallen 4 times since her last visit, and sustained an elbow injury requiring multiple sutures. She feels unsteady at times and gets leg weakness, but denies lightheadedness or syncope. She denies hitting her head.  Current Medications (verified): 1)  Prilosec Otc 20 Mg  Tbec (Omeprazole Magnesium) .... As Needed 2)  Celexa 20 Mg  Tabs (Citalopram Hydrobromide) .... One Daily 3)  Calcium 600/vitamin D 600-200 Mg-Unit  Tabs (Calcium Carbonate-Vitamin D) .... Take 1 Tablet By Mouth Once A Day 4)  Alprazolam 1 Mg  Tabs (Alprazolam) .... One and Half At Bedtime 5)  Warfarin Sodium 5 Mg  Tabs (Warfarin Sodium) .... As Per [facility] 6)  Flexeril 10 Mg  Tabs (Cyclobenzaprine Hcl) .... 1/2 At Bedtime 7)  Levothyroxine Sodium 88 Mcg Tabs (Levothyroxine Sodium) .... One Daily 8)  Cvs Senna-C 8.6 Mg Tabs (Sennosides) .... Take As Needed 9)  Tylenol 325 Mg Tabs (Acetaminophen) .... As Needed 10)  Vitamin E 1000 Unit Caps (Vitamin E) .... Take 1 Capsule By Mouth Once A Day 11)  Vitamin D3 1000 Unit Tabs (Cholecalciferol) .... Take 1 Tablet By Mouth Once A Day 12)  Biotin Cap .... Take 1 Capsule By Mouth Once A Day 13)  Vitamin D 1000 Unit Tabs (Cholecalciferol) .... Take 1 Tablet By Mouth Once A Day 14)  Metoprolol Succinate 25 Mg Xr24h-Tab (Metoprolol Succinate) .... Take One Tablet By Mouth Daily 15)  Ipratropium Bromide 0.02 % Soln (Ipratropium Bromide) .... 4 Puffs Daily  Allergies: 1)  ! Codeine 2)  ! * Tramadol 3)  ! *  Tramadol  Past History:  Past medical history reviewed for relevance to current acute and chronic problems.  Past Medical History: Reviewed history from 02/09/2009 and no changes required. KNEE PAIN (ICD-719.46) IMPINGEMENT SYNDROME (ICD-726.2) UNSPECIFIED OSTEOPOROSIS (ICD-733.00) SHOULDER PAIN, BILATERAL (ICD-719.41) LOW BACK PAIN, CHRONIC (ICD-724.2) COUMADIN THERAPY (ICD-V58.61) FIBRILLATION, ATRIAL (ICD-427.31), Paroxysmal ISCHEMIC COLITIS (ICD-557.9) HYPERTENSION (ICD-401) DEGENERATIVE JOINT DISEASE (ICD-715.90) CAD (ICD-414.00) status post CABG HYPOTHYROIDISM NOS (ICD-244.9) LOW BACK PAIN, CHRONIC (ICD-724.2) GERD ARTHRITIS (ICD-716.90) CONSTIPATION (ICD-564.00) PUD (ICD-533.90) MYOCARDIAL INFARCTION, HX OF (ICD-412) HYPERLIPIDEMIA (ICD-272.4) DEPRESSION (ICD-311) ANXIETY (ICD-300.00) ALLERGIC RHINITIS (ICD-477.9)  Review of Systems       Negative except as per HPI   Vital Signs:  Patient profile:   77 year old female Height:      67 inches Weight:      134 pounds BMI:     21.06 Pulse rate:   73 / minute Pulse rhythm:   regular Resp:     18 per minute BP sitting:   112 / 70  (left arm) Cuff size:   regular  Vitals Entered By: Vikki Ports (July 30, 2009 9:08 AM)  Physical Exam  General:  Pt is alert and oriented, elderly woman, in no acute distress. HEENT: normal Neck: normal carotid upstrokes without bruits, JVP normal Lungs: CTA CV: RRR without murmur or gallop Abd: soft, NT, positive BS, no bruit, no organomegaly Ext: trace bilateral pretibial edema. peripheral pulses 2+ and equal Skin: warm and  dry - several bruises noted    EKG  Procedure date:  07/30/2009  Findings:      NSR with PAC's, nonspecific ST-T abnormality, HR 73 bpm.  Impression & Recommendations:  Problem # 1:  CAD (ICD-414.00) Stable without angina. She has tolerated the addition of a low dose beta blocker and BP well-controlled today. Will change warfarin to ASA (see  below).  The following medications were removed from the medication list:    Warfarin Sodium 5 Mg Tabs (Warfarin sodium) .Marland Kitchen... As per [facility] Her updated medication list for this problem includes:    Metoprolol Succinate 25 Mg Xr24h-tab (Metoprolol succinate) .Marland Kitchen... Take one tablet by mouth daily    Aspirin 325 Mg Tabs (Aspirin) .Marland Kitchen... Take 1 tablet daily  Problem # 2:  FIBRILLATION, ATRIAL (ICD-427.31) Record carefully reviewed. Pt had AF with RVR in 2008 and has not had recurrence by our records. She has been in sinus rhythm on all visits here. With recurrent falls, I would favor discontinuation of coumadin and initiation of ASA 325 mg daily. She is in favor of this as well. She has no prior hx of TIA/stroke.  The following medications were removed from the medication list:    Warfarin Sodium 5 Mg Tabs (Warfarin sodium) .Marland Kitchen... As per [facility] Her updated medication list for this problem includes:    Metoprolol Succinate 25 Mg Xr24h-tab (Metoprolol succinate) .Marland Kitchen... Take one tablet by mouth daily    Aspirin 325 Mg Tabs (Aspirin) .Marland Kitchen... Take 1 tablet daily  Other Orders: EKG w/ Interpretation (93000)  Patient Instructions: 1)  Your physician recommends that you schedule a follow-up appointment in: 6 months with Dr. [name] 2)  Your physician has recommended you make the following change in your medication:  STOP Coumadin.  Start enteric coated Aspirin 325mg   one tablet daily.

## 2010-03-31 NOTE — Progress Notes (Signed)
Summary: Neurosurgeon referral.  Phone Note Outgoing Call   Call placed by: Waldon Reining,  Jul 02, 2009 10:19 AM Call placed to: Specialist Action Taken: Information Sent Summary of Call: I faxed a referral to [facility] to be seen for chronic back pain.

## 2010-03-31 NOTE — Medication Information (Signed)
Summary: ccr-lr  Anticoagulant Therapy  Managed by: Vashti Hey, RN Referring MD: M.Cooper PCP: Dr. Dwana Melena Supervising MD: Diona Browner MD, Remi Deter Indication 1: Atrial Fibrillation (ICD-427.31) Lab Used: [facility] [place] Site: [place] INR POC 3.1  Dietary changes: no    Health status changes: no    Bleeding/hemorrhagic complications: no    Recent/future hospitalizations: no    Any changes in medication regimen? no    Recent/future dental: no  Any missed doses?: no       Is patient compliant with meds? yes       Allergies: 1)  ! Codeine 2)  ! * Tramadol  Anticoagulation Management History:      The patient is taking warfarin and comes in today for a routine follow up visit.  Positive risk factors for bleeding include an age of 49 years or older.  Negative risk factors for bleeding include no history of CVA/TIA.  The bleeding index is 'intermediate risk'.  Positive CHADS2 values include History of HTN and Age > 76 years old.  Negative CHADS2 values include History of Diabetes and Prior Stroke/CVA/TIA.  The start date was 09/19/2006.  Anticoagulation responsible provider: Diona Browner MD, Remi Deter.  INR POC: 3.1.  Cuvette Lot#: [id number].  Exp: 02/12.    Anticoagulation Management Assessment/Plan:      The patient's current anticoagulation dose is Warfarin sodium 5 mg  tabs: As per [facility].  The target INR is 2 - 3.  The next INR is due 08/12/2009.  Anticoagulation instructions were given to patient.  Results were reviewed/authorized by Vashti Hey, RN.  She was notified by Vashti Hey RN.         Prior Anticoagulation Instructions: INR 2.9 Continue coumadin 5mg  once daily except 7.5mg  on Mondays, Wednesdays and Fridays  Current Anticoagulation Instructions: INR 3.1 Take coumadin 1/2 tablet tonight then resume 1 tablet once daily except 1.5 tablets on Mondays, Wednesdays and Fridays

## 2010-03-31 NOTE — Medication Information (Signed)
Summary: ccr-lr  Anticoagulant Therapy  Managed by: Vashti Hey, RN Referring MD: M.Cooper PCP: Franchot Heidelberg, MD Supervising MD: Diona Browner MD, Remi Deter Indication 1: Atrial Fibrillation (ICD-427.31) Lab Used: [facility] [place] Site: [place] INR POC 2.9  Dietary changes: no    Health status changes: no    Bleeding/hemorrhagic complications: no    Recent/future hospitalizations: no    Any changes in medication regimen? no    Recent/future dental: no  Any missed doses?: no       Is patient compliant with meds? yes       Allergies: 1)  ! Codeine 2)  ! * Tramadol  Anticoagulation Management History:      The patient is taking warfarin and comes in today for a routine follow up visit.  Positive risk factors for bleeding include an age of 75 years or older.  Negative risk factors for bleeding include no history of CVA/TIA.  The bleeding index is 'intermediate risk'.  Positive CHADS2 values include History of HTN and Age > 24 years old.  Negative CHADS2 values include History of Diabetes and Prior Stroke/CVA/TIA.  The start date was 09/19/2006.  Anticoagulation responsible provider: Diona Browner MD, Remi Deter.  INR POC: 2.9.  Cuvette Lot#: [id number].  Exp: 02/12.    Anticoagulation Management Assessment/Plan:      The patient's current anticoagulation dose is Warfarin sodium 5 mg  tabs: As per [facility].  The target INR is 2 - 3.  The next INR is due 07/15/2009.  Anticoagulation instructions were given to patient.  Results were reviewed/authorized by Vashti Hey, RN.  She was notified by Vashti Hey RN.         Prior Anticoagulation Instructions: INR 2.3 COntinue coumadin 5mg  once daily except 7.5mg  on Mondays, Wednesdays and Fridays  Current Anticoagulation Instructions: INR 2.9 Continue coumadin 5mg  once daily except 7.5mg  on Mondays, Wednesdays and Fridays

## 2010-06-04 LAB — POCT I-STAT, CHEM 8
Chloride: 105 mEq/L (ref 96–112)
Creatinine, Ser: 1.3 mg/dL — ABNORMAL HIGH (ref 0.4–1.2)
Glucose, Bld: 101 mg/dL — ABNORMAL HIGH (ref 70–99)
Hemoglobin: 12.6 g/dL (ref 12.0–15.0)
Potassium: 4.8 mEq/L (ref 3.5–5.1)
Sodium: 136 mEq/L (ref 135–145)

## 2010-06-04 LAB — PROTIME-INR: Prothrombin Time: 22.4 seconds — ABNORMAL HIGH (ref 11.6–15.2)

## 2010-06-04 LAB — CBC
HCT: 35.1 % — ABNORMAL LOW (ref 36.0–46.0)
Hemoglobin: 12.4 g/dL (ref 12.0–15.0)
MCHC: 35.3 g/dL (ref 30.0–36.0)
RBC: 3.61 MIL/uL — ABNORMAL LOW (ref 3.87–5.11)
RDW: 13.5 % (ref 11.5–15.5)

## 2010-06-04 LAB — URINALYSIS, ROUTINE W REFLEX MICROSCOPIC
Bilirubin Urine: NEGATIVE
Hgb urine dipstick: NEGATIVE
Protein, ur: NEGATIVE mg/dL
Specific Gravity, Urine: 1.01 (ref 1.005–1.030)
Urobilinogen, UA: 0.2 mg/dL (ref 0.0–1.0)

## 2010-06-04 LAB — URINE CULTURE

## 2010-06-04 LAB — DIFFERENTIAL
Basophils Absolute: 0 10*3/uL (ref 0.0–0.1)
Basophils Relative: 1 % (ref 0–1)
Eosinophils Relative: 0 % (ref 0–5)
Lymphocytes Relative: 19 % (ref 12–46)
Monocytes Absolute: 0.2 10*3/uL (ref 0.1–1.0)
Monocytes Relative: 10 % (ref 3–12)
Neutro Abs: 1.4 10*3/uL — ABNORMAL LOW (ref 1.7–7.7)

## 2010-06-13 ENCOUNTER — Telehealth: Payer: Self-pay | Admitting: Cardiovascular Disease

## 2010-06-13 DIAGNOSIS — I4891 Unspecified atrial fibrillation: Secondary | ICD-10-CM

## 2010-06-13 NOTE — Telephone Encounter (Signed)
Pt calling re being taken off coumadin and being put on aspirin, pt having a lot of bruising on arms

## 2010-06-13 NOTE — Telephone Encounter (Signed)
Left message for pt to call back.  Per 07/30/09 office note the pt's coumadin was discontinued.

## 2010-06-13 NOTE — Telephone Encounter (Signed)
I spoke with the pt and she would like to know what she can do about her Aspirin dose since she is bruising.  The pt is not taking Coumadin.  This pt currently takes ASA 325mg  daily and has a history of PAF.  The pt would like Dr [name] to make a decision about ASA.

## 2010-06-13 NOTE — Telephone Encounter (Signed)
Pt can reduce dose to 81 mg daily.

## 2010-06-14 NOTE — Telephone Encounter (Signed)
I spoke with the pt and made her aware that she can decrease her ASA dose to 81mg  once a day due to bruising.  Pt agreed with plan.

## 2010-07-12 NOTE — Consult Note (Signed)
NAMESELETA, HOVLAND                ACCOUNT NO.:  [id number]   MEDICAL RECORD NO.:  [id number]          PATIENT TYPE:  INP   LOCATION:  A311                          FACILITY:  [facility]   PHYSICIAN:  R. Roetta Sessions, M.D. DATE OF BIRTH:  [birth year]   DATE OF CONSULTATION:  09/08/2006  DATE OF DISCHARGE:                                 CONSULTATION   REASON FOR CONSULTATION:  Diverticulitis.   HISTORY OF PRESENT ILLNESS:  [name] is a pleasant 77 year old  Caucasian female with chronic constipation and diverticulitis who, over  the past 3 days, developed diminution in bowel movements.  She usually  has 3 bowel movements every morning.  Over the past couple of days, she  has had this strain, and she has had some intermittent rectal bleeding.  Yesterday and last evening, she developed some left lower quadrant  abdominal pain and a strong urge to vomit but did not vomit.  She never  vomits since her Nissen fundoplication many years ago.  Family members  were summoned.  Ultimately 911 was utilized to get the patient to the  emergency department where she was evaluated.  Pertinent workup has  revealed a non-IV contrast, oral contrast only CT of the abdomen and  pelvis demonstrated massive dilation of the colon from the junction  between descending and sigmoid colon all the way around to the cecum.  There is fluid and stool filled colon all the way around to the cecum.  The cecum measures in excess of 9 cm in diameter.  There is marked  narrowing at the junction of the descending and sigmoid colon.  There is  contrast and air down stream into the rectum.  There is no free air.  There is some fat stranding in the area of the narrowing.  No obvious  abscess, no free air.  Mesenteric vasculature cannot really be assessed  because this was a non-IV contrast study.  This nice lady has been  admitted to the hospital and placed on Fortaz and Flagyl with a  diagnosis of diverticulitis (it is  notable on CAT scan she does have  numerous diverticula and has known diverticulosis on prior colonoscopy  (see below).  She has not a fever or chills.  She has been afebrile thus  far during her hospitalization.  CBC revealed a white count slightly  elevated at 10.8, H&H 11.3 and 33.7.  MCV 97.5, neutrophils 93.  She has  chronic esophageal dysphagia since having her esophagus wrapped here at  [facility] some 20 years ago.   In fact, just recently she saw Dr. Karilyn Cota who performed an EGD on Jul 10, 2006, which reveals mild change of reflux esophagitis, and there is  a question short segment Barrett's biopsies were not correlative.  The  patient also was noted to have a duodenal bulb ulcer felt to be  secondary to Mobic.  H. pylori serologies were found to be negative  recently in Dr. Patty Sermons office.  She was told to avoid Mobic.  It is  also notable that Dr. Karilyn Cota felt that the previously  performed wrap was  somewhat loose.  Ultimately, the GE junction was dilated up to a 19 mm  size with TTS balloon dilators.  Unfortunately, [name] states that  her dysphagia is not improved and, in fact, she feels like it has  worsened since the procedure.   PAST MEDICAL HISTORY:  1. Hypertension.  2. Hypothyroidism.  3. Coronary artery disease.  4. Gastroesophageal reflux disease.  5. Diverticulosis.   PAST SURGICAL HISTORY:  1. Coronary artery bypass grafting.  2. Hernia repair.  3. Appendectomy.  4. Nissen fundoplication.  5. History of diagnostic laparoscopy complicated by viscus perforation      requiring exploratory laparotomy.  Apparently she had a protracted      course, states she almost died.  Her abdominal wound healed in by      secondary attempt.  6. She has a history of sacral surgery as well.  7. History of peptic ulcer disease with negative H. pylori serologies      felt to be secondary to Mobic found just recently incidentally at      EGD.  8. History of hematochezia  previously worked up with colonoscopy in      2004 by Dr. Karilyn Cota revealing hemorrhoids and diverticulosis.  She      had a followup flexible sigmoidoscopy in 2006 for recurrent rectal      bleeding.  She was found to have hemorrhoids and diverticulosis.      Superficial ulcer at the anorectal junction.   MEDICATIONS ON ADMISSION:  1. Lasix 10 mg daily.  2. Calcium daily.  3. Lisinopril 20 mg daily.  4. Prilosec 20 mg twice daily.  5. ASA 81 mg daily.  6. Synthroid 100 mcg daily.  7. Duloxetine 60 mg daily.   ALLERGIES:  CODEINE.   FAMILY HISTORY:  Negative for GI or liver illness.   SOCIAL HISTORY:  The patient does not use alcohol, illicit drugs, or  smoking.  She has a supportive family.   She denies any chest pain, dyspnea, palpitations recently.  No fever or  chills.  Has not had any change in weight.  Otherwise as in history of  present illness.   PHYSICAL EXAMINATION:  Pleasant somewhat hard of hearing elderly female  in no acute distress accompanied by her adopted daughter.  Temp 96.9, pulse 94, respiratory rate 20, BP 128/67.  SKIN:  Warm and dry.  There is no jaundice.  HEENT:  No sclerae icterus.  Conjunctivae are pink.  Oral cavity with no  lesions.  CHEST:  Lungs are clear to auscultation.  HEART:  Regular rate and rhythm without murmur, gallop, rub.  BREASTS:  Deferred.  ABDOMEN:  Nondistended, positive bowel sounds, no bruits.  Abdomen is  soft.  She has localized left lower quadrant tenderness without mass.  She does not have any guarding or rebound.  No appreciable mass,  organomegaly.  EXTREMITIES:  Trace lower extremity edema.   LABORATORY DATA:  Sodium 139, potassium 4.7, chloride 112, CO2 21,  glucose 176, BUN 29, creatinine 1.53.  SGOT, SGPT 32 and 20.  Total  protein 6.6, albumin 3.6, calcium 9.3.   IMPRESSION:  [name] is a very pleasant 77 year old lady with chronic  constipation maintained on senna who has developed an acute illness   characterized by diminution in bowel function over the past 3 days,  right lower quadrant bowel pain, and nausea.  CT demonstrates marked  dilation of her colon starting at the junction between descending and  sigmoid segments.  She also has some stranding on the non-IV contrast  study in this area suggestive of inflammatory process.   I have looked at CT films and discussed them in some length with Dr.  Norva Pavlov.  Although this lady has numerous diverticula and  diverticulitis, certainly remains in the differential ischemic colitis  or thromboembolic disease as well as the less likely possibility of  neoplasm all remain in the differential.  Whatever the process, it seems  to be producing a notable partial large bowel obstruction.   RECOMMENDATIONS:  1. Agree with antibiotics and NPO for the time being.  I would be very      cautious with the use of any laxative therapy at this time.  2. We will check a KUB tomorrow morning.  If her colon is still      markedly dilated, we will go ahead and pursue a Gastrografin enema      to rule out a mechanical obstruction.  3. We will take the liberty of bumping her IV fluids up another 25      mL/hr to see if can normalize her renal function.  I would not rule      out the possibility of a repeat CT in 24-48 hours.  Hopefully, we      could utilize intravenous contrast if a repeat scan is needed.   She is on IV Protonix with which I certainly concur, particularly in  light of her recent diagnosis of NSAID-induced peptic ulcer disease.   I would like to thank the [facility] team for allowing me to  see this nice lady today, and I will be following her along with them  while she is hospitalized.      Jonathon Bellows, M.D.  Electronically Signed     RMR/MEDQ  D:  09/08/2006  T:  09/08/2006  Job:  [id number]

## 2010-07-12 NOTE — Discharge Summary (Signed)
[name], [name]                ACCOUNT NO.:  [id number]   MEDICAL RECORD NO.:  [id number]          PATIENT TYPE:  INP   LOCATION:  A220                          FACILITY:  [facility]   PHYSICIAN:  Marcello Moores, MD   DATE OF BIRTH:  Jun 09, 1923   DATE OF ADMISSION:  09/08/2006  DATE OF DISCHARGE:  07/15/2008LH                               DISCHARGE SUMMARY   TRANSFER SUMMARY:   REASON FOR TRANSFER:  Family's preference.   HISTORY OF PRESENT ILLNESS:  [name] is an 77 year old female patient  with past medical history of coronary artery disease status post graft;  hypothyroidism, which is a stable; hypertension, fairly controlled; and  chronic renal insufficiency.  The patient presented to the emergency  room on July 12 with complaints of abdominal pain associated with nausea  and vomiting.  CT scan of the abdomen was done during the admission  which showed diverticulitis, and the patient was put on antibiotics for  that, and Flagyl, and she was evaluated by GI and followed as well.  She  remained fairly stable.  She was afebrile, and there was not any  significant leukocytosis as well.  Last night, she developed shortness  of breath, and she had atrial fibrillation with rapid response, new  onset, and she was managed with Lasix as well as with beta blocker, and  her rate was controlled and the shortness of breath was also improved.  The family asked during the night as well to transfer the patient to  [facility], and in the morning also they asked me and they called Dr.  Ladona Ridgel, her primary doctor, also to be transferred to [facility] in the  morning.  She was also evaluated by cardiologist for her night episode  of atrial fibrillation and possibly congestive heart failure as well.  She was managed, and after discussing with [facility] hospitalist in  [facility], the patient was transferred to [facility] as per the family  preferring.   DIAGNOSIS DURING THE TRANSFER:  1. New  onset atrial fibrillation controlled with beta blocker.  2. Shortness of breath which was resolved after the control of atrial      fibrillation and Lasix.  3. Diverticulitis without any abscess.  She is on antibiotic.  4. Hypertension, fairly controlled.  5. Hypothyroidism, on medication, stable.  6. Coronary artery disease status post bypass graft, stable.   MEDICATIONS:  1. Fortaz 1 gram every 12 hours IV.  2. Cipro 400 mg IV every 12 hours.  3. Levothyroxine 100 mcg p.o. daily.  4. Metoprolol 25 mg p.o. daily.  5. Cymbalta 60 mg p.o. daily.  6. Lasix 40 mg IV t.i.d.  7. Lisinopril 20 mg p.o. daily.  8. Flagyl 500 mg IV every 6 hours.  9. Protonix 40 mg IV every 24 hours.  10.Senokot one tablet p.o. at bedtime.  11.Tylenol 650 mg p.o. every 6 hours p.r.n.  12.Dulcolax 10 mg p.r.n. for constipation.  13.Dilaudid 1 mg IV every 4 hours for pain p.r.n.  14.Zofran 4 mg IV every 8 hours p.r.n. for nausea and vomiting.  15.Oxycodone  5 mg p.o. every 4 hours p.r.n.  16.Phenergan 12.5 mg IV every 4 hours p.r.n.   PHYSICAL EXAMINATION:  VITAL SIGNS: Blood pressure 117/48, pulse rate  68, temperature 97, pulse rate 64 per minute, and she is saturating 96%  on 2 liters.  HEENT: She has pink conjunctivae, nonicteric sclera.  NECK:  Supple.  CHEST:  Good air entry bilaterally with scattered rales on the bilateral  posterior area.  CVS: S1-S2 well heard.  Currently it is regular.  ABDOMEN:  Soft.  There is slight tenderness on the left lower quadrant,  normoactive bowel sounds.  No guarding.  EXTREMITIES:  She has trace pedal edema and peripheral pulses palpable.  CNS:  She is alert and fairly oriented.   CONDITION ON TRANSFER:  The patient is stable.   PLAN OF TRANSFER:  As per preference of the family.  Otherwise, she  could be managed in [facility].  She is transferred to [facility], and she needs GI follow-up as well as Cardiology follow-  up.      Marcello Moores, MD  Electronically Signed     MT/MEDQ  D:  09/11/2006  T:  09/12/2006  Job:  [id number]

## 2010-07-12 NOTE — Procedures (Signed)
[name], [name]                ACCOUNT NO.:  [id number]   MEDICAL RECORD NO.:  [id number]          PATIENT TYPE:  INP   LOCATION:  A220                          FACILITY:  [facility]   PHYSICIAN:  Noralyn Pick. Eden Emms, MD, FACCDATE OF BIRTH:  May 11, 1923   DATE OF PROCEDURE:  09/11/2006  DATE OF DISCHARGE:  09/11/2006                                ECHOCARDIOGRAM   PROCEDURE:  A 2-D echocardiogram   INDICATIONS:  Atrial fibrillation, heart failure.   Left jugular cavity size was upper limits of normal.  There was an EF of  50-55% with inferior posterior basal hypokinesis.  Mitral valve had  posterior annular calcification with mild MR.  There was mild left  atrial enlargement.  There was moderate right atrial enlargement,  moderate RV enlargement.  There appeared to be severe TR with a dilated  IVC.   Peak tricuspid velocity was in the 3 meters per second range.   Aortic valve was trileaflet and sclerotic.  There was no significant  stenosis.  Aortic root was normal.  Subcostal imaging appeared to show a  left pleural effusion with no ASD and no source of embolus and no  pericardial effusion.   M-mode measurements showed an aortic dimension of 32 mm, left atrial  dimension 48 mm, septal thickness 11 mm, LV diastolic dimension 45 mm,  systolic dimension 32 mm.   FINAL IMPRESSION:  1. Normal left ventricular size.  Ejection fraction 50-55% with      inferobasal hypokinesis.  2. Right ventricular and right atrial enlargement with severe      tricuspid regurgitation.  Clinical correlation suggested for a      question of pulmonary hypertension.  3. Left pleural effusion.  4. Aortic valve sclerosis.  5. Mild mitral regurgitation.  6. No pericardial effusion.  7. No source of embolus.      Noralyn Pick. Eden Emms, MD, [facility]  Electronically Signed     PCN/MEDQ  D:  09/11/2006  T:  09/12/2006  Job:  [id number]

## 2010-07-12 NOTE — H&P (Signed)
[name], CURLESS NO.:  [id number]   MEDICAL RECORD NO.:  [id number]          PATIENT TYPE:  INP   LOCATION:  A311                          FACILITY:  [facility]   PHYSICIAN:  Skeet Latch, DO    DATE OF BIRTH:  04-07-1923   DATE OF ADMISSION:  09/07/2006  DATE OF DISCHARGE:  LH                              HISTORY & PHYSICAL   PRIMARY PHYSICIAN:  Dr. Alton Revere.   CHIEF COMPLAINT:  Abdominal pain.   HISTORY OF PRESENT ILLNESS:  This is a 77 year old occasion female who  presents with complaint of abdominal pain.  Per family members the  patient began to have abdominal pain with cramping and nausea and  vomiting approximately 9:30 p.m. this evening.  According to them, the  patient has been having this cramping sharp stabbing pains with nausea  and vomiting over the past few months.  The patient has been seen by Dr.  Karilyn Cota .  He did a EGD, and found ulcer.  She was placed on Prilosec  p.o. b.i.d..  The patient has continued to have these episodes of  abdominal pain with nausea and vomiting and cramping sensation post EGD;  and tonight pain was so severe the patient needed to come to the  emergency room for evaluation.   On my exam the patient was resting comfortably.  Patient has received  some IV pain medicine and was sleeping on my exam.   PAST MEDICAL HISTORY INCLUDES:  1. Hypertension.  2. Hypothyroidism.   PAST SURGICAL HISTORY:  1. Coronary artery bypass graft.  2. Hernia repair.  3. Appendectomy.  4. Esophageal surgery.  5. Exploratory laparotomy as well as sacral surgery.   SOCIAL HISTORY:  Denies any smoking, alcohol or illicit drug use.   ALLERGIES:  CODEINE.   REVIEW OF SYSTEMS:  I am unable to obtain; the patient was sleeping on  my exam.   HOME MEDICATIONS INCLUDE:  1. Lasix 10 mg daily.  2. Calcium daily.  3. Lisinopril 20 mg daily.  4. Prilosec 20 mg twice a day.  5. Aspirin 81 mg once a day.  6. Synthroid 100 mcg once a day.  7.  Duloxetine 60 mg daily.   PHYSICAL EXAMINATION:  VITAL SIGNS:  Temperature 97.0, respirations 18,  pulse 74, blood pressure is 124/47.  GENERAL:  The patient is sleeping on my exam appears in no acute  distress and comfortable.  The patient looks stated age.  She looks well-  hydrated and well-nourished.  HEENT:  Head is atraumatic, normocephalic.  PERRL.  EOMI.  NECK:  Supple, nontender, nondistended.  No JVD.  No thyromegaly  appreciated.  CARDIOVASCULAR:  Regular rate and rhythm.  No murmurs, rubs, or gallops.  RESPIRATORY:  Breath sounds are equal bilaterally.  No rales, rhonchi or  wheezing.  ABDOMEN:  Soft, nondistended.  No hepatosplenomegaly.  No guarding or  rebound tenderness present.  Positive bowel sounds.  RECTAL EXAM:  Performed by the emergency room physician.  EXTREMITIES:  No clubbing, cyanosis or edema is appreciated.  NEUROLOGIC:  Cranial nerves II-XII are grossly intact.  RADIOLOGIC FINDINGS:  CT of abdomen showed some mild edema/inflammation  surrounding the distal descending and proximal sigmoid colon without  substantial wall thickening.  Features suggesting a diverticulitis,  although focal colitis could have this appearance.   LABS:  Microscopic on the urinalysis showed 2 white blood cells and rare  bacteria.  UA -- a small amount of hemoglobin, moderate bilirubin, small  amount of ketones and proteins present, nitrite negative, a small amount  of leukocyte esterase.  Sodium 138, potassium 4.3, chloride 110, CO2 of  19, glucose 137, BUN 26, creatinine is 1.48.  She had alkaline  phosphatase of 130, AST 32, ALT is 20, lipase 24.  White count 11.9,  hemoglobin 12.5, hematocrit 37.2.  She had a platelet count of 253.   ASSESSMENT:  This is an 77 year old female who presents with a few  months' history of abdominal pain with cramping and nausea and vomiting.  The patient had a recent EGD that showed ulcers; and was placed on  Prilosec.  The patient presents  tonight with similar complaints and on  CT it has suggested diverticulitis.   PLAN:  1. For her diverticulitis:  The patient was started on the Fortaz in      the emergency room.  This will be continued IV as well as Flagyl      I.V.  Secondary to the patient being seen by Dr. Karilyn Cota in the      past, GI will be consulted.  Also we will get a dietary consult.      The patient will be kept n.p.o. for now; and diet will be advanced      as tolerated.  2. For her abdominal pain:  I believe this is secondary to her      diverticulitis.  The patient will be maintained on Dilaudid 1 mg IV      q.4 h. p.r.n. as well as oral pain medications as needed.  3. For her nausea and vomiting:  The patient will be placed on IV      Phenergan and Zofran as needed.  4. For acute renal insufficiency:  Unsure if this is acute versus      chronic.  We will IV hydrate the patient.  At this point, this may      be secondary to dehydration.  Recheck a BUN/creatinine in the a.m..  5. For history of coronary artery disease:  This seems to be stable,      at this point.  The patient will be maintained      on her aspirin.  6. For her hypothyroidism:  We will continue her home Synthroid and      check a TSH level in the a.m..  The patient will be maintained on a      DVT as well as a GI prophylaxis.      Skeet Latch, DO  Electronically Signed     SM/MEDQ  D:  09/08/2006  T:  09/08/2006  Job:  [id number]   cc:   Dr. Alton Revere

## 2010-07-12 NOTE — Assessment & Plan Note (Signed)
[facility]                            CARDIOLOGY OFFICE NOTE   Haley, Pittman                       MRN:          [id number]  DATE:06/03/2007                            DOB:          15-Jan-1924    [name] was seen in followup at the [facility] Cardiology Office on  June 03, 2007.  [name] is a delightful 77 year old woman with  paroxysmal atrial fibrillation and coronary artery disease status post  CABG.  [name] is currently fairly well from a symptomatic  standpoint.  She had a great deal of difficulty last summer with both GI  and cardiac problems requiring a prolonged hospitalization.  However,  she has recovered nicely; and is really back to her baseline at present.  She has occasional palpitations at rest.  She denies any exertional  symptoms at present.  She specifically denies chest pain, dyspnea,  orthopnea, edema, or lightheadedness.   CURRENT MEDICATIONS:  1. Coumadin as directed.  2. Stool softener b.i.d.  3. Osteo Bi-Flex b.i.d.  4. Lasix 20 mg daily.  5. Pravastatin 20 mg daily.  6. Lisinopril 10 mg daily.  7. Synthroid 112 mcg daily.  8. Citalopram 20 mg daily.  9. Prilosec 20 mg daily.  10.Calcium Plus D 1200 mg twice daily.  11.Multivitamin daily.  12.Xanax at bedtime.   ALLERGIES:  CODEINE.   PHYSICAL EXAMINATION:  On exam the patient is an elderly woman in no  distress.  Weight 135, blood pressure 130/62, heart rate 59, respiratory rate 16.  HEENT:  Normal.  NECK:  Normal carotid upstrokes without bruits.  Jugular venous pressure  is normal.  LUNGS: Clear to auscultation bilaterally.  HEART:  Regular rate and rhythm without murmurs or gallops.  ABDOMEN:  Soft, nontender, no organomegaly.  EXTREMITIES:  No clubbing, cyanosis, or edema.  Peripheral pulses 2+ and  equal.   EKG:  From the office today showed sinus bradycardia, and otherwise is  within normal limits.   CAROTID ULTRASOUND:  Showed stable  bilateral carotid disease in the  range of 40%-59%.   ASSESSMENT:  1. Coronary artery disease status post coronary artery bypass graft.  2. Paroxysmal atrial fibrillation.  3. Mild-to-moderate bilateral carotid stenosis.  4. Hypertension.  5. Dyslipidemia.   DISCUSSION:  [name] is currently doing well from a symptomatic  standpoint.  She actually has no cardiovascular symptoms at present.  I  reviewed the rationale for long-term anticoagulation for stroke  reduction in the setting of paroxysmal atrial fibrillation, and she is  agreeable to continue.  She has been off of antiplatelet therapy; and, I  think, that this is reasonable in the setting of advanced age and need  for long-term Coumadin.  She has stable coronary disease after CABG with  no symptoms suggestive of any ongoing ischemic process.   [name] will be scheduled back at 50-month intervals. She follows  regularly with Dr. Erby Pian, and prefers to come in to our office less  frequently; which, I think, is fine.  If she has problems in the  interim, I would be happy to  see her sooner.     [name]. [name], MD  Electronically Signed    MDC/MedQ  DD: 06/03/2007  DT: 06/03/2007  Job #: [id number]   cc:   Franchot Heidelberg, M.D.

## 2010-07-12 NOTE — Consult Note (Signed)
[name], [name]                ACCOUNT NO.:  [id number]   MEDICAL RECORD NO.:  [id number]          PATIENT TYPE:  INP   LOCATION:  A220                          FACILITY:  [facility]   PHYSICIAN:  Noralyn Pick. Eden Emms, MD, FACCDATE OF BIRTH:  03-23-1923   DATE OF CONSULTATION:  DATE OF DISCHARGE:                                 CONSULTATION   REFERRING PHYSICIAN:  Dr. Benson Setting.   REASON FOR CONSULTATION:  Ms. [name] is an 77 year old patient admitted  on September 07, 2006 to [facility] by the [facility] for  nausea, vomiting and diverticulitis.   The patient has previously been seen by Dr. Karilyn Cota.  The patient started  having crampy abdominal pain with nausea and vomiting; it has been going  on for a few months.  She was seen by Dr. Karilyn Cota.  She had an EGD and  was found to have ulcerative disease and was started on Prilosec.   She subsequently was admitted to the hospital with an elevated white  count and a CT scan showing diverticulitis.   We are asked to see the patient, as last night she went into rapid  atrial fibrillation and appeared to have some congestive heart failure.  From a cardiac perspective, the patient has had previous coronary artery  bypass surgery in 1998.  Her LV function is thought to be in the normal  range.  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   Medical therapy was not indicated by Dr. Clovis Riley and Dr. Riley Kill.  She was  to F/U with Dr. [name]   In talking to the daughter, the patient is usually quite active.  She  has been ill with her abdominal disease.  They are actually quite  concerned and want the patient transferred to [facility].  I told her we would  help facilitate this, but she would need to be transferred to a medical  service such as [facility] and that we would be happy to follow her heart  there.  In talking to her, she is not having any chest pain.  The  daughter has noted a weight gain over the last 7-10 days with some lower  extremity edema.   Last night the patient was acutely short of breath; she was treated  appropriately with beta blockers and Lasix and it has improved this  morning   REVIEW OF SYSTEMS:  Her review of systems is primarily remarkable for  fatigue and abdominal pain.  There have been no fevers.  Her antibiotics  have been adjusted and Cipro IV was started yesterday.  She has not had  any enteral or peripheral nutrition since her illness.   MEDICATIONS PRIOR TO ADMISSION:  1. Plavix 75 mg a day.  2. Lasix 20 mg a day.  3. Lisinopril 10 mg a day.  4. Synthroid 75 mcg a  day.  5. Amlodipine 5 mg a day.  6. Meloxicam p.r.n.  7. Prilosec 20 mg a day.  8. Co-Q enzyme.  9. Glucosamine.  10.Cymbalta 60 mg a day.   ALLERGIES:  She is allergic to CODEINE and ULTRACET.   SOCIAL HISTORY:  She is a retired Architectural technologist.  Both her son and  daughter were in the room with her and are very concerned,  appropriately.  She is married.  Her husband has severe scoliosis, but  apparently is quite active.  She primarily enjoys shopping.  She has  never smoked and denied alcohol.   FAMILY HISTORY:  Noncontributory.   PHYSICAL EXAMINATION:  GENERAL:  Her exam is remarkable for a somewhat  ill-appearing and lethargic 77 year old white female.  VITAL SIGNS:  Her saturations are currently 95% on 2 L.  She is sinus  rhythm at a rate of 70.  Blood pressure is 120/70.  HEENT:  Normal.  NECK:  JVP is elevated with a significant V wave.  There are faint  bilateral carotid bruits.  LUNGS:  Basilar rales.  CARDIAC:  There is an S1 and S2 with a soft systolic murmur.  PMI is  normal.  ABDOMEN:  Remarkable for no rebound.  It is not surgical, but she does  have significant left lower  quadrant pain.  Bowel sounds are positive.  There is no hepatosplenomegaly or hepatojugular reflux.  There is no  AAA.  EXTREMITIES:  Distal pulses are intact with trace edema.  NEUROLOGIC:  Nonfocal.  There is no muscle weakness.   LABORATORY AND ACCESSORY CLINICAL DATA:  Her EKG showed rapid atrial  fibrillation with nonspecific ST-T wave changes.   Lab work is remarkable for a BNP of 430, CPK of 46, troponin of 1.2,  white count 11.6, hematocrit 28.8, BUN 33, creatinine 1.3.   Chest x-ray last night showed mild congestive heart failure.   Her KUB showed distended colon with no free air.   Her CT scan showed slight decrease in colonic distention with mild left-  sided colitis versus diverticulitis.  There is no abscess.  There was  mild anasarca with pleural effusions.   IMPRESSION:  1. Volume overload:  The patient is currently volume-overloaded.  She      will need to be placed on IV diuretics.  Her oral diuretics are      likely not being absorbed due to her diverticulitis.  She will be      placed on Lasix 40 mg intravenously b.i.d.  We will have to watch      her BUN and creatinine closely.  Clinically, she certainly does      have diverticulitis and I do not think there is a primary cardiac      problem going on here.  2. History of coronary artery bypass surgery:  Troponin 0.12.  I doubt      that she is having active ischemia.  We will have to continue      following her CPKs and troponins.  We will put her on Lopressor      b.i.d.  3. Atrial fibrillation, paroxysmal:  It may be worthwhile to have her      on a short course of amiodarone if this recurs.  For the time-      being, I would not place her on any antiarrhythmics.  4. The patient is essentially bedbound and needs subcutaneous for deep      venous thrombosis prophylaxis.  5. ?  Right sided failure with significant TR and V wave on exam ?      clinical history of ascites with increased abdominal girth and       weight per daughter.  Not sure if this would represent 3rd spacing      form abdominal process or other process.  Will assess RV function      and PA pressures by echo  6. Diverticulitis:  Cipro added reccently ? need for      hyperalimentation.  G.I. following Abdomen does not appear surgical      at this time  7. The patient's course has not been improving.  The family is very      concerned about her diverticulitis and overall medical care.  She      would like to be transferred to [facility] and I think this is      appropriate.  Hopefully, Dr. Benson Setting will make arrangements to      transfer her to the [facility] at [facility] and we would be happy      to follow her cardiac care there.   In regards to diagnostic testing, she will have a 2-D echocardiogram to  assess her LVRV function.   I spent in excess of 30 minutes talking to the patient's children and  answered all their questions.  I also told them I though the should  probably be started on peripheral nutrition.      Noralyn Pick. Eden Emms, MD, [facility]  Electronically Signed     PCN/MEDQ  D:  09/11/2006  T:  09/11/2006  Job:  [id number]

## 2010-07-12 NOTE — Assessment & Plan Note (Signed)
[facility]                            CARDIOLOGY OFFICE NOTE   Haley, Pittman                       MRN:          [id number]  DATE:10/25/2006                            DOB:          05-08-1923    [name] was seen in followup in the [facility] Cardiology office on  October 25, 2006.  She is a delightful 77 year old woman who was recently  hospitalized for atrial fibrillation with RVR.  She had a number of GI  symptoms during her hospitalization and ultimately underwent EGD, and  she had symptoms suggestive of colitis at that time.  She is continuing  to improve.  She is feeling much better at this visit than she was 2  weeks ago.  She had low blood pressures when leaving the hospital, and  once her antihypertensive medications were discontinued, she started to  rebound, and her failure to thrive is greatly improved.  She started  physical therapy and is doing well with that.  She is not having any  chest pain, dyspnea, palpitations, orthopnea, PND, light-headedness, or  syncope.  She does have some edema of her legs.   CURRENT MEDICATIONS:  1. Aspirin 81 mg daily.  2. Synthroid 100 mcg daily.  3. Lasix 20 mg every other day.  4. Cymbalta 60 mg daily.  5. Metoprolol 12.5 mg b.i.d.  6. Coumadin as directed.  7. Xanax 1 mg daily.  8. Prilosec 20 mg twice daily.  9. Docusate twice daily.  10.Calcium +D twice daily.  11.Osteo BiFlex.  12.Fish oil.   ALLERGIES:  CODEINE.   EXAM:  She is alert and oriented, elderly woman, in no acute distress.  Weight is 140 pounds, blood pressure 150/53, heart rate 55, respiratory  rate 16.  HEENT:  Normal.  NECK:  Normal carotid upstrokes without bruits.  Jugular venous pressure  is normal.  LUNGS:  Clear to auscultation bilaterally.  HEART:  Regular rate and rhythm without murmurs or gallops.  ABDOMEN:  Soft and nontender.  No organomegaly.  EXTREMITIES:  There is trace pretibial edema bilaterally.   No clubbing  or cyanosis.   ASSESSMENT:  [name] is improving.  Cardiac problems as follows.  1. Paroxysmal atrial fibrillation.  The patient appears to be in sinus      rhythm today.  Continue Coumadin and metoprolol at current doses.      She is clinically improving.  2. Hypertension.  Systolic blood pressure mildly elevated today.  I      have reviewed her home readings, which are excellent and ranging      from the low 120s to 130s systolic over 50 diastolic.  Her heart      rate has been ranging in the high 50s and low 60s.  I am going to      continue her on low dose metoprolol and make no other changes to      her medical regimen.  3. Coronary artery disease.  Stable without symptoms.  Continue      secondary risk reduction.   FOLLOWUP:  I would like to  see [name] back in 3 months.  No changes  were made to her medical regimen today.     [name]. [name], MD  Electronically Signed    MDC/MedQ  DD: 10/25/2006  DT: 10/26/2006  Job #: [id number]   cc:   Lionel December, M.D.

## 2010-07-12 NOTE — Assessment & Plan Note (Signed)
[facility]                            CARDIOLOGY OFFICE NOTE   ARMONII, SIEH                       MRN:          [id number]  DATE:10/11/2006                            DOB:          01/31/1924    [name] presents for hospital followup at the [facility] Cardiology  office on October 11, 2006.  She is an 77 year old woman who has been  followed in the past by Salvadore Farber, M.D.  She was admitted to  [facility] on July 15 with colitis.  At presentation, she was  also noted to be in atrial fibrillation with rapid ventricular response.  This was complicated by diastolic heart failure.  She was placed on  Cardizem and metoprolol, and ultimately converted back to sinus rhythm.  She has been anticoagulated with Coumadin in the setting of her atrial  fibrillation.  Ms. Megill has had a difficult time since her hospital  discharge.  She has been extremely weak and her appetite has been poor.  She has been hypotensive and her cardiac medications have had to be  decreased.  She has discontinued Diltiazem and cut back on her  metoprolol dose to 12.5 mg twice daily.  She is slowly feeling better  now that she has cut back on her medications.  However, she still has  significant fatigue.  She has noted blood pressures as low as 80/30s.  She has been light-headed, but has not had frank syncope.  She notes  that, over the last few days, her blood pressure has finally started to  improve.  She denies chest pain or dyspnea.  Her abdominal pain has  resolved as well.   CURRENT MEDICATIONS:  1. Aspirin 81 mg daily.  2. Synthroid 100 mcg daily.  3. Lasix 20 mg every other day.  4. Cymbalta 60 mg daily.  5. Metoprolol 12.5 mg twice daily.  6. Coumadin as directed.  7. Xanax 1 mg daily.  8. Prilosec 20 mg twice daily.  9. Docusate.  10.Calcium.  11.Osteo BiFlex.  12.Fish oil.   ALLERGIES:  CODEINE.   EXAM:  She is alert and oriented.  She is  an elderly woman in no acute  distress.  Weight is 137 pounds, blood pressure 128/48, heart rate is 56,  respiratory rate is 16.  HEENT:  Normal.  NECK:  Normal carotid upstrokes without bruits.  Jugular venous pressure  is normal.  LUNGS:  Clear to auscultation bilaterally.  HEART:  Regular rate and rhythm without murmurs or gallops.  ABDOMEN:  Soft and nontender.  No organomegaly.  EXTREMITIES:  No cyanosis, clubbing, or edema.  Peripheral pulses are 2+  and equal throughout.   ELECTROCARDIOGRAM:  Demonstrates normal sinus rhythm with a heart rate  of 59 beats per minute.  She has nonspecific T wave and ST segment  changes.   ASSESSMENT:  [name] is stable from a cardiac standpoint.  Her  cardiac-related problems are as follows.  1. Paroxysmal atrial fibrillation.  I will continue with low-dose      metoprolol.  It is clear that she  was over-medicated when she left      the hospital, and I suspect that she became mildly dehydrated as      her appetite is poor.  She seems to have stabilized with cutting      back her metoprolol and discontinuing Cardizem.  She should      continue with Coumadin for stroke prophylaxis.  I have asked her to      record her heart rate and blood pressures over the next few weeks,      and I would like to follow up with her closely in 2 weeks to make      sure that she is continuing in the right direction toward recovery.  2. Coronary artery disease.  She underwent a cardiac catheterization      by Dr. Riley Kill in 2007, and she had stable anatomy with patent      grafts at that point.  She is having no angina.  Continue with      medical therapy.   As above, I will plan on seeing [name] back in a few weeks to review  her blood pressures and follow up with her clinically.     [name]. [name], MD  Electronically Signed    MDC/MedQ  DD: 10/11/2006  DT: 10/12/2006  Job #: [id number]

## 2010-07-12 NOTE — Op Note (Signed)
[name], [name]                ACCOUNT NO.:  [id number]   MEDICAL RECORD NO.:  [id number]          PATIENT TYPE:  AMB   LOCATION:  DAY                           FACILITY:  [facility]   PHYSICIAN:  Lionel December, M.D.    DATE OF BIRTH:  Feb 26, 1924   DATE OF PROCEDURE:  07/10/2006  DATE OF DISCHARGE:                               OPERATIVE REPORT   PROCEDURE:  Esophagogastroduodenoscopy with esophageal dilation.   INDICATIONS:  Ms. Runnion is 77 year old Caucasian female who presents  with epigastric pain as well as dysphagia.  She had a normal CBC, LFTs  and LFTs.  She had upper GI series which suggested impaired esophageal  motility slight delay in passage of barium pill across the GE junction  filling defect in this area felt to be due to her wrap and a question of  bulbar ulcer.  She is undergoing diagnostic/therapeutic procedure.   Procedure risks were reviewed the patient, informed consent was  obtained.   MEDS FOR CONSCIOUS SEDATION:  Benzocaine spray for pharyngeal topical  anesthesia, Demerol 50 mg IV, Versed 5 mg IV, in divided dose.   FINDINGS:  Procedure performed in endoscopy suite.  The patient's vital  signs and O2 sat were monitored during procedure and remained stable.  The patient was placed left lateral position and Pentax videoscope was  passed oropharynx without any difficulty into esophagus.   Esophagus.  Mucosa of the esophagus normal.  GE junction was wavy or  serrated located at 39 centimeters from the incisors.  There is a  question of patch of Barrett's.  Biopsy was taken from this area on the  way out.   Stomach was empty and distended very well insufflation.  Folds proximal  stomach were normal.  Examination mucosa at body, antrum, pyloric  channel was normal.  Angularis, fundus and cardia were also normal.  The  wrap was felt to be loose.   Duodenum.  There was bulbar scarring with pseudodiverticula along  anteromedial wall.  There was 10 x 4 mm  ulcer hidden between folds.  Pictures taken for the record.  Scope was passed second part of the  duodenum where mucosa and folds were normal.  Endoscope was withdrawn.  Endoscope was withdrawn in the body of the stomach.  Balloon dilator was  passed through the scope and positioned across the distal esophagus.  There was also some focal erythema on the right side at the GE junction.  This area was dilated initially to 18 mm and subsequently to 19 mm  without mucosal disruption.  Balloon was deflated withdrawn.  The  stomach was decompressed, balloon was withdrawn.  Biopsy was taken from  the mucosa at distal esophagus as the scope was withdrawn.  The patient  tolerated the procedure well.   FINAL DIAGNOSIS:  Mild changes of reflux esophagitis limited to GE  junction with a wavy/serrated GE junction, biopsy taken to rule out  short segment Barrett's.  Fundal wrap was loose.  Bulbar ulcer which was  felt to be secondary to the Mobic which has been discontinued.  Please  note her  H pylori was done in the office last week and is negative.   RECOMMENDATIONS:  She will continue a PPI double dose.  When she  finishes sample she will go back to omeprazole at 20 mg p.o. b.i.d.  prescription given for 60 with two refills.   I will be contacting the patient with results of biopsy.   As her borderline TSH is concerned further changes in her Synthroid  would be per Dr. Geanie Cooley.  My office will fax the lab data.      Lionel December, M.D.  Electronically Signed     NR/MEDQ  D:  07/10/2006  T:  07/10/2006  Job:  [id number]   cc:   Corrie Mckusick, M.D.  Fax: [phone]

## 2010-07-12 NOTE — Discharge Summary (Signed)
[name], [name] NO.:  [id number]   MEDICAL RECORD NO.:  [id number]          PATIENT TYPE:  INP   LOCATION:  2025                         FACILITY:  [facility]   PHYSICIAN:  Marcellus Scott, MD     DATE OF BIRTH:  1923-04-08   DATE OF ADMISSION:  09/11/2006  DATE OF DISCHARGE:  09/20/2006                               DISCHARGE SUMMARY   ADDENDUM:   PRIMARY CARE PHYSICIAN:  Alton Revere, M.D.   CARDIOLOGIST:  [name]. [name], M.D.   GASTROENTEROLOGIST:  Hedwig Morton. Juanda Chance, M.D., [place], [place].   This is an addendum to the discharge summary that was done by Dr. Lavera Guise  on September 18, 2006.  It will outline the patient's care since September 18, 2006.   DISCHARGE DIAGNOSES:  1. Ischemic colitis.  2. Diverticulosis.  3. Internal Hemorrhoids.  4. Paroxysmal atrial fibrillation, now in normal sinus rhythm.  5. Anemia.  6. Diastolic heart failure, stable.  7. Hypertension.  8. Hypothyroidism.  9. Protein-calorie malnutrition.  10.Coronary artery disease, status post coronary artery bypass      grafting.  11.Gastroesophageal reflux disease.   DISCHARGE MEDICATIONS:  1. Metoprolol 25 mg p.o. twice daily.  2. Synthroid 100 mcg p.o. daily.  3. Senokot 2 tablets p.o. q.h.s.  4. Enteric-coated aspirin 81 mg p.o. daily.  5. Cardizem CD 180 mg p.o. daily.  6. Xanax 0.5 mg p.o. q.h.s. p.r.n.  7. Lasix 40 mg p.o. daily.  8. K-Dur 20 mEq p.o. b.i.d.  9. Prilosec 20 mg p.o. b.i.d.  10.Anusol HC 25 mg suppositories, 1 per rectum q.h.s. p.r.n.  11.Ensure 1 can p.o. 3 times a day.  12.Coumadin 3 mg p.o., 1 dose tonight and then further dosing based on      daily INRs.  The patient has an appointment to follow up with the      cardiologist at 9 a.m. on September 21, 2006 for a PT/INR.   PROCEDURES SINCE September 18, 2006:  Colonoscopy on September 19, 2006.  Please  refer to GI notes for further details.   IMPRESSION:  1. Severe ischemic colitis.  2. Internal hemorrhoids.  3.  Diverticulosis.   PERTINENT LABORATORY DATA:  CBC with a hemoglobin of 9.4, hematocrit  28.3, platelets 396, white blood cells 6.  Basic metabolic panel  unremarkable with a BUN of 22, creatinine of 1.12, PT of 15, with an INR  of 1.2.   CONTINUED CONSULTATIONS:  1. Cardiology.  2. Gastroenterology.   HOSPITAL COURSE SINCE September 18, 2006:  The patient had bowel prep  overnight on September 18, 2006 for the colonoscopy.  Her Coumadin had been  held for 2 days.  The patient has remained hemodynamically stable.  She  had her colonoscopy yesterday, with findings as indicated above.  She  was resumed on the diet post-procedure which she has tolerated.  Gastroenterology has followed her up and have indicated that from their  standpoint she is okay to be discharged on a low residue diet for the  next 7-10 days, Anusol per rectum p.r.n., and to follow up with the  GI  M.D. in [place] on a p.r.n. basis.  The patient's hemoglobin had  dropped slightly but has remained stable in the 9.4 range.  She has had  a BM this morning which was normal-colored stool with no obvious blood.  The patient was also followed by cardiology who have cleared her for  discharge and will follow her outpatient as far as the anticoagulation  is concerned. Patient is stable for discharge home.      Marcellus Scott, MD  Electronically Signed     AH/MEDQ  D:  09/20/2006  T:  09/20/2006  Job:  [id number]   cc:   [name]. [name], MD  Hedwig Morton. Juanda Chance, MD  Rosario Jacks

## 2010-07-12 NOTE — Assessment & Plan Note (Signed)
[facility]                            CARDIOLOGY OFFICE NOTE   [name], [name]                       MRN:          [id number]  DATE:01/31/2007                            DOB:          10-Dec-1923    [name] returns for followup at the [facility] Cardiology office on  January 31, 2007.  [name] is a delightful 77 year old woman who has  paroxysmal atrial fibrillation and coronary artery disease status post  CABG.  She is doing very well from a cardiac standpoint.  She has had no  chest pain, dyspnea, orthopnea, PND or edema.  She has had no  palpitations, lightheadedness or syncope.  She did have a mild fall  where she felt unsteady on her feet.  Fortunately, she was making her  bed at the time and fell forward onto the bed.  She sustained no injury.  She tells me that she has felt a bit unsteady on her feet but has had no  other falls.  She has no other complaints today.   MEDICATIONS:  1. Synthroid 112 mcg daily.  2. Cymbalta 60 mg daily.  3. Coumadin as directed.  4. Xanax 1 mg at bedtime.  5. Prilosec 20 mg twice daily.  6. Stool softener twice daily.  7. Calcium Plus D daily.  8. Osteo Bi-Flex twice daily.  9. Metoprolol 12.5 mg twice daily.  10.Lasix 40 mg daily.  11.Pravastatin 20 mg daily.  12.Lisinopril 20 mg daily.   ALLERGIES:  CODEINE.   EXAM:  The patient is alert and oriented.  She is in no acute distress.  Weight is 139, blood pressure is 140/61, heart rate 74, respiratory rate  16.  HEENT:  Normal.  NECK:  Normal carotid upstrokes with soft bilateral carotid bruits.  Jugular venous pressure is normal.  LUNGS:  Clear to auscultation bilaterally.  HEART:  Regular rate and rhythm without murmurs or gallops.  ABDOMEN:  Soft, nontender.  No organomegaly, no abdominal bruits.  EXTREMITIES:  No clubbing, cyanosis or edema.  Peripheral pulses are  intact and equal bilaterally.   ASSESSMENT:  1. Coronary artery disease  status post coronary artery bypass graft.      Patient remains stable on medical therapy.  Continue low-dose beta-      blocker as she tolerates.  She has had some trouble with      hypotension so I would like not to increase her beta-blocker dose      even though she is on an extremely low dose at this point.  She      will continue with Coumadin for her paroxysmal atrial fibrillation      and I think at age 7, with stable coronary disease, the risk of      adding antiplatelet therapy from a bleeding standpoint outweighs      its potential benefit.  Will continue her on Coumadin alone.  2. Paroxysmal atrial fibrillation.  She remains in sinus rhythm and is      asymptomatic.  Continue Coumadin for anticoagulation.  3. Hypertension.  This has been managed by  Dr. Erby Pian.  She      continues on metoprolol and lisinopril.  4. Dyslipidemia.  She is tolerating low-dose Pravachol.  Her lipids      are followed by Dr. Erby Pian as well.   FOLLOWUP:  I would like to see [name] back in 4 months.  At that  time she will be due for a carotid ultrasound.  She has mild to moderate  bilateral carotid stenosis with velocities indicating 40-59% stenosis.     [name]. [name], MD  Electronically Signed    MDC/MedQ  DD: 01/31/2007  DT: 01/31/2007  Job #: [id number]   cc:   Franchot Heidelberg, M.D.

## 2010-07-12 NOTE — Discharge Summary (Signed)
NAMESCOUT, GUYETT NO.:  [id number]   MEDICAL RECORD NO.:  [id number]          PATIENT TYPE:  INP   LOCATION:  2025                         FACILITY:  [facility]   PHYSICIAN:  Lonia Blood, M.D.       DATE OF BIRTH:  1923/11/05   DATE OF ADMISSION:  09/11/2006  DATE OF DISCHARGE:                               DISCHARGE SUMMARY   DISCHARGE DIAGNOSES:  1. Segmental colitis, work up in progress.  2. Paroxysmal atrial fibrillation, resolved.  3. Diastolic heart failure exacerbation, resolved.  4. Hypertension.  5. Hypothyroidism.  6. Protein caloric malnutrition.  7. Coronary artery disease, status post coronary artery bypass      grafting.  8. Status post appendectomy.  9. Status post hernia repair.  10.Gastroesophageal reflux disease.   MEDICATIONS:  1. Xanax 1 mg by mouth at bedtime.  2. Aspirin 81 mg daily.  3. Diltiazem 180 mg daily.  4. Resource 3 times a day.  5. Lasix 40 mg daily.  6. Synthroid 100 mcg daily.  7. Metoprolol 25 mg twice a day.  8. Protonix 40 mg twice a day.  9. Potassium 20 mEq twice a day.  10.Senokot 2 tablets at bedtime.   CONDITION ON DISCHARGE:  Will be dictated at the time of the actual  discharge of the patient, but we anticipate discharge home with home  health, physical therapy, occupational therapy and followup with the  primary care physician, as well as with Dr. Tonny Bollman from  cardiology.   CONSULTATIONS:  During this admission, the patient was seen in  consultation by the [facility] cardiology group as well as by the [facility]  gastroenterology group.   PROCEDURE:  During this admission the patient has a planned flexible  sigmoidoscopy the results of which are pending ad the time of my  dictation.   HISTORY AND PHYSICAL:  The patient was admitted by Dr. Eda Paschal on  09/11/06, as a transfer from [facility] at the family's request.  The patient was transferred after she experienced an episode of  paroxysmal  atrial fibrillation at [facility]. The patient was  admitted in [facility] with complaints of abdominal pain,  nausea and vomiting. She also developed acute distention of her abdomen.   HOSPITAL COURSE:  1. Abdominal pain with acute abdominal distention. Upon admission the      patient was placed NPO when she received Dulcolax suppository. She      promptly had a large bowel movement which relieved her distention.      We felt that the patient's  distention was related to her segmental      colitis and constipation. The patient was continued on      metronidazole to complete 1 week of antibiotics. After      discontinuation of the antibiotics, the patient had unfortunately      an episode of fever, chills and worsening abdominal pain which      prompted a gastroenterological consult for consideration of flex      sigmoidoscopy. All cultures that were collected at [facility] are negative for significant gastroenterological      pathogens. In fact, the patient did not have diarrhea while she was      here in the hospital at [facility]. We do feel that the patient's      colitis could be ischemic and the flexible sigmoidoscopy should      answer this question.   1. Paroxysmal atrial fibrillation. The patient's measured ejection      fraction is at 50% to 55%. We felt that the patient's atrial      fibrillation was related to diastolic heart failure. The patient      did respond nicely to rate reduction therapy and a decision was      made to fully anticoagulate the patient. She will be followed by      Dr. [name] and she will be discharged on Coumadin and follow up      with the [facility].   1. Weakness, deconditioning and failure to thrive. The patient was      encouraged to use a protein-rich diet and to increase her level of      physical activity. The patient's TSH level was checked and it was      felt that the patient's   hypothyroidism was adequately repleted.      Lonia Blood, M.D.  Electronically Signed     SL/MEDQ  D:  09/18/2006  T:  09/18/2006  Job:  [id number]   cc:   [name]. [name], MD  Franchot Heidelberg, M.D.

## 2010-07-13 ENCOUNTER — Other Ambulatory Visit: Payer: Self-pay | Admitting: Cardiovascular Disease

## 2010-07-13 DIAGNOSIS — I6529 Occlusion and stenosis of unspecified carotid artery: Secondary | ICD-10-CM

## 2010-07-15 ENCOUNTER — Other Ambulatory Visit: Payer: Self-pay | Admitting: *Deleted

## 2010-07-15 ENCOUNTER — Encounter (INDEPENDENT_AMBULATORY_CARE_PROVIDER_SITE_OTHER): Payer: Medicare Other | Admitting: *Deleted

## 2010-07-15 DIAGNOSIS — I6529 Occlusion and stenosis of unspecified carotid artery: Secondary | ICD-10-CM
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
## 2010-07-15 NOTE — Cardiovascular Report (Signed)
NAME:  [name], [name]                          ACCOUNT NO.:  [id number]   MEDICAL RECORD NO.:  [id number]                   PATIENT TYPE:  INP   LOCATION:  2024                                 FACILITY:  [facility]   PHYSICIAN:  Salvadore Farber, M.D. [facility]         DATE OF BIRTH:  [birth year]   DATE OF PROCEDURE:  02/24/2002  DATE OF DISCHARGE:                              CARDIAC CATHETERIZATION   PROCEDURE:  Coronary angiography, SVG angiography x3, LIMA angiography, left  subclavian angiography, left heart catheterization, left ventriculography.   INDICATION:  The patient is a 77 year old lady status post coronary artery  bypass grafting by Dr. Dorris Fetch in 1998.  She did well since that surgery  until approximately 1 month ago when she noted the onset of relatively  profound fatigue as well as chest discomfort which has been relatively  constant but improved with sublingual nitroglycerin and worse with exertion.  The pain has markedly increased over the past 5 days.  She is referred for  diagnostic angiography.   DIAGNOSTIC TECHNIQUE:  Informed consent was obtained.  Under 1% lidocaine  local anesthesia, a 6-French sheath was placed in the right femoral artery  using the modified Seldinger technique.  A JL-4 catheter was used to engage  the left main coronary artery.  A JR-4 catheter was used to engage the  native RCA, all saphenous vein grafts, and the left subclavian artery.  Subclavian angiography was performed by hand injection.  The catheter was  exchanged over a wire to a LIMA catheter which was selectively engaged in  the left internal mammary artery.  The LIMA was atretic and engagement  resulted in dampening.  Therefore, the catheter was pulled just outside the  LIMA and angiography performed by hand injection.  Left ventriculography and  left heart pressure measurement was performed using a straight pigtail  catheter in the ROA projection.   COMPLICATIONS:  None.   FINDINGS:  1. LV: 137/2/15.  EF 65% without regional wall motion abnormality.  No     mitral regurgitation or aortic stenosis.  2. Left main: There is an eccentric 30% stenosis of the distal vessel.  3. LAD: The LAD is a large vessel giving rise to two diagonal branches.     There is a long 60-70% stenosis of the proximal vessel.  There is a 70%     stenosis of a mid vessel just after the takeoff of the first diagonal     branch.  The saphenous vein graft to D2 is widely patent and supplies     both D2 and the LAD.  The LIMA to the distal LAD is atretic.  4. Ramus intermedius: Small vessel which is angiographically normal.  5. Circumflex: The circumflex is a moderate-sized vessel giving rise to a     single obtuse marginal branch.  It is angiographically normal.  6. RCA: The RCA is occluded in its mid section.  The  saphenous vein graft to     the PLV and a separate saphenous vein graft to the PDA are both widely     patent.  7. Normal left subclavian artery.   IMPRESSION/RECOMMENDATIONS:  The patient has widely patent saphenous vein  grafts to posterior descending artery, posterior left ventricle, and  diagonal 2.  The left internal mammary artery is atretic presumably due to  the unobstructed supply of the mid and distal left anterior descending via  the saphenous vein graft to the second diagonal branch.  The only potential  territory which may account for her discomfort is unsupplied by the first  diagonal branch.  The lesion of the proximal left anterior descending  appears only moderate but is fairly diffuse.  To answer this question, we  will obtain an Adenosine-Cardiolite in the morning.  In the meantime, we  will continue with her current medical therapy.                                               Salvadore Farber, M.D. [facility]    WED/MEDQ  D:  02/24/2002  T:  02/25/2002  Job:  [id number]   cc:   Corrie Mckusick, M.D.  [address] Laurell Josephs. [address]  [place]  [place] [address]  Fax:  [phone]   Jesse Sans. Wall, M.D. [facility]  [address]  [place]  [place] [address]  Fax: [phone]

## 2010-07-15 NOTE — H&P (Signed)
NAME:  [name], [name]                          ACCOUNT NO.:  [id number]   MEDICAL RECORD NO.:  [id number]                   PATIENT TYPE:   LOCATION:                                       FACILITY:  [facility]   PHYSICIAN:  Lionel December, M.D.                 DATE OF BIRTH:  September 21, 1923   DATE OF ADMISSION:  08/25/2002  DATE OF DISCHARGE:                                HISTORY & PHYSICAL   CHIEF COMPLAINT:  Rectal bleeding.   HISTORY OF PRESENT ILLNESS:  [name] is a 77 year old Caucasian female  who presents today for further evaluation of intermittent hematochezia.  She  called last week with complaints of intermittent hematochezia, requesting a  colonoscopy.  It was arranged for her to be seen today.  She has been seen  by Dr. Karilyn Cota in the past.  The last time was in 1991, at which time she had  an EGD and a colonoscopy.  Colonoscopy was done for Hemoccult-positive  stools at that time.  Cecum could only be viewed from a distance.  She  underwent a barium enema which revealed mild diverticulosis but otherwise  unremarkable.  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  She was on NSAID's at that time,  which were recommended to be discontinued.  She presents today saying that  she has done very well over the last 10 years or so.  For the last six  months, she has had small-volume hematochezia intermittently; however, a  week ago she developed an episode of heavier bleeding without defecation.  It lasted for only a few minutes.  She has chronic constipation, generally  has a bowel movement every couple of days for which she uses Senna p.r.n.  Denies any melena, abdominal pain, nausea, vomiting, or heartburn.  She  takes Zantac p.r.n.  Denies any dysphagia, odynophagia, or weight loss.   Complicated past medical history.  Please see below.   CURRENT MEDICATIONS:  1. Lasix 20 mg daily.  2. Norvasc 5 mg daily.  3. Altace 5 mg daily.  4. Plavix 75 mg daily.  5. Bextra 20 mg b.i.d.  6. Synthroid 75 mg daily.  7. Lipitor 5 mg daily.  8. Arthro-7 vitamin daily.  9. Zantac 150 mg daily p.r.n.  10.      Vitamin D 800 mg daily.  11.      Calcium 1500 mg daily.  12.      Centrum Silver 1 daily.  13.      Biotin 1500 mg daily.  14.      Potassium 1 daily.  15.      Senna p.r.n.   ALLERGIES:  1. CODEINE.  2. ASPIRIN.   PAST MEDICAL HISTORY:  1. History  of coronary artery disease, status post MI in 1998.  She had four-     vessel coronary artery bypass graft.  She is on Plavix chronically, as     she reports an aspirin sensitivity.  2. Hypercholesterolemia.  3. Hypothyroidism.  4. Chronic constipation.  5. History of TB in 1953.  6. History of gastroesophageal reflux disease, as well as peptic ulcer     disease.  7. She has had a Nissen fundoplication surgery on her esophagus for a     persistent esophageal stricture.  This included a truncal vagotomy and     pyloroplasty and esophageal dilatation in 1982.  8. She has had multiple EGD's in the past with evidence of peptic ulcer     disease and persistent esophagitis.  Last EGD as outlined above.  9. She had a diagnostic laparoscopy which was complicated by a small bowel     perforation and pelvic abscess with peritonitis.  This was followed by a     second exploratory laparotomy.  The patient reports having postoperative     cardiac problems, possibly arrhythmia versus cardiac arrest.  History not     well defined.  10.      She has also had a cholecystectomy in 1989 and a presacral     neurectomy in 1960.   FAMILY HISTORY:  Mother died of stomach cancer, history of an MI, and father  died of an MI.  She had a sister who died of brain cancer.  Another sister  died of unknown type cancer.  Denies any family history  of colorectal  cancer.   SOCIAL HISTORY:  She has been married for 53 years and has two children.  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  Never been a smoker.  Denies any alcohol use.   REVIEW OF SYSTEMS:  Please see HPI for GI, for general.  CARDIOPULMONARY:  Denies any chest pain or shortness of breath.   PHYSICAL EXAMINATION:  VITAL SIGNS:  Weight 162, height 5 feet 7 inches,  temperature 97.2 degrees, blood pressure 120/80, pulse 78.  GENERAL:  A pleasant, thin Caucasian female in no acute distress.  SKIN:  Warm and dry.  No jaundice.  HEENT:  Conjunctivae are pink.  Sclerae are nonicteric.  Oropharyngeal  mucosa moist and pink.  No lesions, erythema, or exudate.  No  lymphadenopathy or thyromegaly.  CHEST:  Lungs are clear to auscultation.  CARDIAC:  Regular rate and rhythm.  Normal S1, S2.  No murmurs, rubs, or  gallops.  ABDOMEN:  Positive bowel sounds.  Soft, nontender, nondistended.  No  organomegaly or masses.  RECTAL:  Examination deferred to time of colonoscopy.  EXTREMITIES:  No edema.   IMPRESSION:  Intermittent small-volume hematochezia in setting of chronic  constipation.  It may be due to benign anorectal source.  She is overdue for  colonoscopy at this time and I have recommended diagnostic colonoscopy given  findings.  She has a complicated history of ulcers, esophageal strictures,  status post Nissen fundoplication, as well as surgery on her esophagus.  She  has been doing very well over the last  10 years with regards to upper GI problems.    PLAN:  1. Colonoscopy in the near future.  2. We will have her hold her Plavix for four days.  She has requested     Visicol prep, which Dr. Karilyn Cota has agreed.  3. Further recommendations to follow.  Tana Coast, P.A.                        Lionel December, M.D.   LL/MEDQ  D:  08/25/2002  T:  08/25/2002  Job:  [id number]   cc:   Corrie Mckusick, M.D.  [address] [place].  [place]  [place]  [place] [address]  Fax: [phone]

## 2010-07-15 NOTE — Op Note (Signed)
Haley, Pittman                ACCOUNT NO.:  [id number]   MEDICAL RECORD NO.:  [id number]          PATIENT TYPE:  AMB   LOCATION:  DAY                           FACILITY:  [facility]   PHYSICIAN:  Lionel December, M.D.    DATE OF BIRTH:  October 27, 1923   DATE OF PROCEDURE:  12/07/2004  DATE OF DISCHARGE:                                 OPERATIVE REPORT   PROCEDURE:  Esophagogastroduodenoscopy followed by a flexible sigmoidoscopy.   INDICATION:  [name] is a 77 year old Caucasian female with intermittent  epigastric pain felt to be secondary to NSAID use who had abdominal pelvic  CT which shows a soft tissue density at GE junction,  It is unclear whether  this is secondary to her antireflux surgery or a mass. She is therefore  undergoing diagnostic EGD. She states her NSAID therapy was changed and she  is not having epigastric pain any more. She also has chronic hematochezia.  She states she has bled every day for over a year. She is therefore  undergoing diagnostic flexible sigmoidoscopy. Her last colonoscopy was in  July 2004, revealing left colonic diverticulosis, melanosis coli and  hemorrhoids. Procedures were reviewed with the patient and informed consent  was obtained.   PREMEDICATION:  Cetacaine spray for pharyngeal topical anesthesia, Demerol  50 mg IV and Versed 3 mg IV.   FINDINGS:  Procedures performed in endoscopy suite. The patient's vital  signs and O2 sat were monitored during the procedure and remained stable.   PROCEDURE:  1.  Esophagogastroduodenoscopy. The patient was placed in the left lateral      recumbent position and Olympus videoscope was passed through oropharynx      without any difficulty into esophagus.   Esophagus:  Mucosa of the esophagus normal except there was erythema at GE  junction indicative of mild reflux esophagitis. GE junction was at 38 cm.   Stomach:  It was empty. It had some bile in it but there was no food debris.  Stomach distended very well  with insufflation. Folds of proximal stomach  were normal. Examination of the mucosa revealed patchy erythema and some  scarring in antrum. Biopsies were taken to make sure that this was chronic  gastritis and not an infiltrative process. Pyloric channel was patent.  Angularis, fundus and cardia were examined by retroflexing the scope. She  had fundal wrap but there was no mass noted.   Duodenum:  Bulbar mucosa revealed some scarring and a linear erosion. The  scope was passed to the second part of duodenum. The mucosa and folds were  normal. Endoscope was withdrawn and the patient prepared for procedure #2.   Flexible sigmoidoscopy. Rectal examination performed. No abnormality noted  external or digital exam. Olympus videoscope was placed in the rectum and  advanced into sigmoid colon. Scope was passed to 40 cm. Scattered  diverticula noted in the sigmoid colon. As the scope was withdrawn, colonic  mucosa was examined for the second time and was normal. Rectal mucosa  similarly was normal. Scope was retroflexed to examine anorectal junction.  She had a small superficial ulcer  at anorectal junction along with prominent  hemorrhoids below the dentate line. Pictures taken for the record and  endoscope was withdrawn. The patient tolerated the procedures well.   FINAL DIAGNOSES:  1.  Mild changes of reflux esophagitis limited the GE junction.  2.  Pseudo mass at the GE junction secondary to fundal wrap.  3.  Antral gastritis. This possibly secondary to NSAID therapy, biopsy taken      to rule out infiltrative process.  4.  Erosive bulbar duodenitis.  5.  Sigmoid colon diverticulosis.  6.  Small superficial ulcer at anorectal junction with external hemorrhoids.      This ulcer appears to be benign. This this area was too low for me to      take biopsy with conscious sedation. Please note that since she has been      using Canasa suppositories her bleeding has completely stopped.    RECOMMENDATIONS:  1.  Antireflux measures reinforced.  2.  We suggest she take a Zantac 150 mg p.o. nightly.  3.  She should continue with high-fiber diet and stay on Canasa suppository      one per rectum at bedtime for total of two months. She will return for      __________ in three months.      Lionel December, M.D.  Electronically Signed     NR/MEDQ  D:  12/07/2004  T:  12/07/2004  Job:  [id number]   cc:   Dr. Phillips Odor

## 2010-07-15 NOTE — Procedures (Signed)
[facility]  Patient:    [name], CORRON Visit Number: [id number] MRN: [id number]          Service Type: OUT Location: RAD Attending Physician:  Cain Sieve Dictated by:   Delton See, P.A. Proc. Date: 05/08/01 Admit Date:  05/08/2001   CC:         Patrica Duel, M.D.   Stress Test  DATE OF BIRTH:  02-17-1924  BRIEF HISTORY:  Ms. Clinger is a pleasant 77 year old female with a history of coronary artery bypass graft surgery in 1998, history of hyperlipidemia as well as hypertension.  She was recently seen in the office by Dr. Daleen Squibb on May 02, 2001.  At that time her blood pressure was mildly high and her Norvasc was increased.  She was scheduled for a Persantine Cardiolite to further evaluate her history of coronary artery disease.  Prior to the study today the patient had no complaints of chest pain or shortness of breath.  She did note that she was very sore after her last Persantine Cardiolite some years ago.  Her baseline EKG showed sinus rhythm, rate 61.  There was poor R-wave progression and T-wave inversions in V1 as well as V2.  She had a sinus arrhythmia.  Blood pressure was 140/60.  Persantine was administered minutes one through four.  Cardiolite was given at six minutes.  The patient had some stinging in her right arm at the IV site, otherwise she was asymptomatic during the test.  There were no significant EKG changes.  No aminophylline was given.  The final images are pending at time of this dictation. Dictated by:   Delton See, P.A. Attending Physician:  Cain Sieve DD:  05/08/01 TD:  05/09/01 Job: [id number]

## 2010-07-15 NOTE — Cardiovascular Report (Signed)
[name], Pittman NO.:  [id number]   MEDICAL RECORD NO.:  [id number]          PATIENT TYPE:  INP   LOCATION:  2031                         FACILITY:  [facility]   PHYSICIAN:  Arturo Morton. Riley Kill, M.D. [facility] OF BIRTH:  1923/05/02   DATE OF PROCEDURE:  06/23/2005  DATE OF DISCHARGE:                              CARDIAC CATHETERIZATION   INDICATIONS:  [name] is a 77 year old who presents with weakness over  the past couple of weeks.  She was found to be quite dehydrated.  BUN and  creatinine were elevated.  This has been mainly fatigue and weakness. EKG  demonstrates some T-wave changes.  She has had previous bypass surgery.  She  was last restudied in 2003.  She now is brought back to the catheterization  laboratory for repeat angiography.   PROCEDURE:  1.  Selective coronary arteriography.  2.  Saphenous vein graft angiography.  3.  Selective left internal mammary angiography.   DESCRIPTION OF PROCEDURE:  The patient was brought to the cath lab after  informed consent and prepped and draped in usual fashion.  We used an  anterior puncture with easy access.  6-French sheath was placed.  We then  took views of the left coronary and right coronary arteries. Limited  contrast was used.  Views of the saphenous vein graft to the diagonal,  saphenous vein graft to the PLA, and saphenous vein graft to the PDA were  all obtained.  We did a subclavian shot into the internal mammary because of  the previous finding of damping in this vessel.  She tolerated all this well  without complication.  I reviewed the films with her daughter.  There were  no complications.   HEMODYNAMIC DATA:  1.  Central aortic pressure 138/47, mean 92.   ANGIOGRAPHIC DATA:  1.  There is calcification of the left coronary artery which is moderately      extensive  2.  The left main appears in some views to have about 40-50% narrowing in      its distal most aspect.  This is best seen in  the RAO cranial views.      Much of the disease probably involves the ostium of the LAD.  The LAD      has several tandem lesions of about 80% leading into the first diagonal.      The vessel beyond this is the LAD which is segmentally plaqued.  3.  The saphenous vein graft to the diagonal appears widely patent.  There      is vigorous flow which backfills into the LAD and there is vigorous      filling of the LAD.  4.  The internal mammary to the LAD is atretic.  It is small in caliber      consistent with the findings on the previous study.  5.  The circumflex has a tiny little intermediate vessel that has some      ostial narrowing.  The AV circumflex has 30-40% narrowing and the first      marginal probably 30-40% narrowing in  its ostium as well.  6.  The right coronary artery is totally occluded.  7.  The saphenous vein graft to the PLA is widely patent and fills the      second and third PLA as well as some retrograde filling to the PDA.  8.  The saphenous vein graft to the PDA is also intact and fills      aggressively into the posterolateral system in a reverse fashion.   CONCLUSION:  1.  Continued patency of saphenous vein graft to the diagonal which fills      the LAD.  2.  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  3.  Continued patency of the saphenous vein graft to the posterolateral      artery in the right coronary vessel.  4.  Continued patency of saphenous vein graft to the PDA.  5.  Moderate disease of the distal left main and the proximal left anterior      descending artery.   DISPOSITION:  The patient does have T-wave inversion throughout the entire  precordial leads.  The left main in most views does not appear to be  critical, but in one view does show up to about 50-60% narrowing.  Importantly, the flow goes into the diagonal and also into the circumflex  system.  Whether or not this is  affected is unclear.  Once she is  rehydrated, we will recheck her EKG. Adenosine-Cardiolite would likely be  very helpful in identifying the extent of ischemia as we have a previous  study from 2003.  If this is dramatically changed, she may require  percutaneous intervention, although this is not ideal.  I will review this  with my colleagues.      Arturo Morton. Riley Kill, M.D. [facility]  Electronically Signed     TDS/MEDQ  D:  06/23/2005  T:  06/24/2005  Job:  [id number]   cc:   [name], M.D.  Fax: [phone]   [name], M.D.  Fax: [phone]

## 2010-07-15 NOTE — H&P (Signed)
NAME:  [name], [name]                          ACCOUNT NO.:  [id number]   MEDICAL RECORD NO.:  [id number]                   PATIENT TYPE:  INP   LOCATION:  2024                                 FACILITY:  [facility]   PHYSICIAN:  Olga Millers, M.D. [facility]            DATE OF BIRTH:  [birth year]   DATE OF ADMISSION:  02/22/2002  DATE OF DISCHARGE:                                HISTORY & PHYSICAL   REASON FOR ADMISSION:  The patient was admitted on February 22, 2002, at  11:30 p.m. with the chief complaint of chest pain.   HISTORY OF PRESENT ILLNESS:  [age]-year-old white female presents with  chest pain that has been increasing over the last three to four week that is  similar to her typical previous angina in 1998.  The chest pain comes on and  is described as a deep tightness and she feels it at the base of her neck in  the upper back region.  This pain began three to four weeks ago and has  dramatically increased since February 20, 2002.  The pain is occasionally  constant but has been decreased by sublingual nitroglycerin and it is highly  reminiscent of her previous angina.  It also increases with exertion.  She  does not note recent shortness of breath, dyspnea on exertion, orthopnea,  PND, palpitations, edema, cough, syncope, presyncope, etc.  She further  reports recent decreased energy and having to rest after only 15 minutes of  light activity.   PAST MEDICAL HISTORY:  1. Coronary artery disease/myocardial infarction on September 15, 1996.  The     patient underwent four vessel bypass surgery.  The records are currently     not available.  This was performed on September 23, 1996 and complicated by     postoperative pneumonia and right lower extremity hematoma at the site of     her vein graft harvest.  The patient has been followed by Dr. Daleen Squibb and     last had a Persantine Cardiolite in March 2003. This was normal by report     with a normal size LV and an LV-EF of 65%.  2.  Hypothyroidism.  3. Hypertension.  4. Hyperlipidemia.  5. Gastroesophageal reflux disease with fundoplication and hiatal hernia.  6. History of postoperative PSVT.  7. History of hammer toe repair.  8. History of TB in 1953.   CURRENT MEDICATIONS:  1. Synthroid 75 mcg q.d.  2. Plavix 75 mcg q.d.  3. Altace 5 mg q.d.  4. Norvasc 10 mg q.d.  5. Lipitor 5 mg q.h.s.  6. Coumadin 5-10 mg q.d.  7. Lasix 20 mg q.d.  8. Bextra 40 mg q.d.  9. Zantac 150 mg q.d.  10.      Foltx q.d.  11.      Xanax 1 mg p.o. q.d.   SOCIAL HISTORY:  [place]  [place]band.  She is accompanied during the interview by her husband and  daughter.  She has no tobacco use, no alcohol and is currently not  exercising.  The patient has a marked family history of CAD with her father  dying of an MI at age 2 and a brother having MI at a young age as well.  All other review of systems are negative.   PHYSICAL EXAMINATION:  VITAL SIGNS:  On physical exam, temperature is  afebrile.  Pulse is 62, respirations 20, blood pressure 130/70.  GENERAL:  She is a well-developed, well-nourished female in no apparent  distress.  HEENT:  Normocephalic, atraumatic, PERRLA, EOMI.  Mucous membranes moist.  Oropharynx and oral cavity are clear.  NECK:  Supple.  No bruits, no JVD.  There are 2+ carotid pulses, no  lymphadenopathy.  CARDIOVASCULAR:  Regular rate and rhythm.  Normal S1 and S2.  No S3 or S4.  Normal PMI.  LUNGS:  Clear to auscultation bilaterally with no wheezes, rales or rhonchi.  SKIN:  No lesions.  ABDOMEN:  Soft, nontender, nondistended, no hepatosplenomegaly.  EXTREMITIES:  No cyanosis, clubbing or edema.  She has 2+ distal lower  extremity pulses.  NEUROLOGIC:  Exam is intact.   LABORATORY DATA:  Chest x-ray is pending.  EKG shows a rate of 66 with  normal sinus rhythm, a normal axis, and anterolateral T-wave inversions that  are unchanged from an EKG performed in  January 2001.  Labs are currently  pending.   ASSESSMENT AND PLAN:  The patient is a 77 year old white female with a  history of MI and CABG in 1998 who presents with unstable angina and typical  chest pain.  There is decrease with nitroglycerin and increase with  exertion.  This has been occurring for three to four weeks.  We will admit  her for rule out MI and will have a low threshold for catheterization  especially if she develops positive enzymes or EKG changes.   1. Unstable angina. The patient will be placed on Lovenox.  I will not start     a beta blocker for now since her heart rate is in the 60s, and she is     currently pain-free and doing quite well.  We watch for any change of her     enzymes or EKG.  All of her other medicines will be continued.  2. Hypertension.  Continue current plan.  3. Hyperlipidemia.  We will check a lipid panel.  4. History of thyroid abnormalities. We will check a TSH and continue her     Synthroid.  5. Further plans per Dr. Daleen Squibb with [facility].     Heloise Beecham, M.D. [facility]                Olga Millers, M.D. [facility]    DWM/MEDQ  D:  02/23/2002  T:  02/23/2002  Job:  [id number]

## 2010-07-15 NOTE — Assessment & Plan Note (Signed)
[facility]                              CARDIOLOGY OFFICE NOTE   [name], [name]                       MRN:          [id number]  DATE:11/02/2005                            DOB:          May 17, 1923    PRIMARY CARE PHYSICIAN:  Dr. Dorthey Sawyer.   HISTORY OF PRESENT ILLNESS:  [name] is a 77 year old lady, previously  cared for by Dr. Dorethea Clan, who wishes to transfer her care to me, since he is  moving.  She has a history of coronary artery disease, for which she is  status post coronary artery bypass grafting surgery in 1998.  She underwent  cardiac catheterization in April of this year, demonstrating patent vein  graft to the PLV, patent vein graft to the PDA, patent vein graft to the  diagonal which fills the LAD, and an atretic LIMA to the LAD.  Left  ventricular systolic function is normal.  She has been feeling well without  any angina, orthopnea, PND or edema.  She is mostly limited by  osteoarthritis of her knees and ankle.   CURRENT MEDICATIONS:  1. Plavix 75 mg per day.  2. Lasix 20 mg per day.  3. Lisinopril 10 mg per day.  4. Synthroid 75 mcg per day.  5. Amlodipine 5 mg per day.  6. Meloxicam 7.5 per day.  7. Prilosec 20 mg p.r.n.  8. Coenzyme Q10.  9. Over-the-counter potassium 99 mg per day.  10.Multivitamin.  11.Calcium.  12.Vitamin D.  13.Glucosamine.  14.Cymbalta 60 mg per day.  15.Biotin.   ALLERGIES:  CODEINE and ULTRACET.   SOCIAL HISTORY:  She is a retired [employer] officer, trade [employer].  She is married with 2  children.  She says her hobby is shopping.  She never smoked, and denies  alcohol and illicit drug use.   PHYSICAL EXAMINATION:  GENERAL:  She is generally well appearing, in no  distress.  VITAL SIGNS:  Heart rate is 67, blood pressure 130/68.  She is 5 feet 7  inches tall, and weighs 132 pounds.  NECK:  She has no jugular venous distention, thyromegaly or lymphadenopathy.  LUNGS:  Clear to auscultation.  CARDIAC:  She has a nondisplaced point of maximal cardiac impulse.  There is  a sternotomy that is well-healed.  She has a regular rate and rhythm without  murmur, rub or gallop.  ABDOMEN:  Soft, nondistended and nontender.  There is no hepatosplenomegaly.  Bowel sounds are normal.  There is no midline pulsatile mass.  EXTREMITIES:  Warm without clubbing, cyanosis, edema or ulceration.  PT  pulses are 2+ bilaterally without bruit.  NEUROLOGIC:  She is alert and oriented x3 with normal neurologic exam.  Normal affect.   Electrocardiogram demonstrates normal sinus rhythm with nonspecific ST-T  abnormalities.   IMPRESSION/RECOMMENDATIONS:  1. Coronary disease:  Generally doing very nicely after coronary artery      bypass graft.  The LIMA is atretic, but all other grafts are open, and      her anterior wall is well supplied via the vein graft to the diagonal.  She requests minimizing medications as much as possible.  She is also      concerned by skin bruising.  I told her I thought it was reasonable if      we stop her Plavix in favor of aspirin 81 mg daily.  There is no proven      benefit to Plavix over aspirin in stable patients such as her.  In      addition, we will discontinue the amlodipine and increase the      lisinopril to 20 mg per day.  I have let her know that she should keep      the amlodipine, and it is certainly possible that her blood pressure      will not be controlled with the increased dose of lisinopril.  If not,      could consider going back on it.  I also let her know that there is no      proven benefit to Coenzyme Q10 or Biotin.  I will continue her on the      Lasix with dose unchanged.  2. Hypertension:  Well controlled today.  With changes in the lisinopril      and amlodipine, will need close attention.  I have asked her to follow      up with Dr. Phillips Odor in this regard.  3. Question of carotid disease:  I see the patient had a duplex done in       [place], will obtain this.                                 Salvadore Farber, MD    WED/MedQ  DD:  11/02/2005  DT:  11/03/2005  Job #:  [id number]   cc:   Corrie Mckusick, M.D.

## 2010-07-15 NOTE — Discharge Summary (Signed)
[name], [name] NO.:  [id number]   MEDICAL RECORD NO.:  [id number]          PATIENT TYPE:  INP   LOCATION:  2031                         FACILITY:  [facility]   PHYSICIAN:  Vida Roller, M.D.   DATE OF BIRTH:  05/23/1923   DATE OF ADMISSION:  06/21/2005  DATE OF DISCHARGE:  06/26/2005                                 DISCHARGE SUMMARY   PROCEDURES:  1.  Cardiac catheterization.  2.  Coronary arteriogram.  3.  Left ventriculogram.  4.  Adenosine Myoview.   PRIMARY DIAGNOSES:  1.  Chest pain, atretic left internal mammary artery to left anterior      descending artery at catheterization with 80% proximal and mid left      anterior descending artery stenosis, Myoview without ischemia.  2.  Preserved left ventricular function with an ejection fraction of 72% at      [facility] and greater than 50% at echocardiogram.  3.  Carotid artery disease with bilateral bruits but less than 40% stenosis      on Dopplers.  4.  History of peptic ulcer disease related to aspirin.  She is therefore      intolerant to aspirin.  5.  Hypertension.  6.  Hyperlipidemia.  7.  Hypothyroidism.  8.  Gastroesophageal reflux disease symptoms.  9.  Family history of coronary artery disease.  10. Intolerance to codeine as well as aspirin.   TIME AT DISCHARGE:  43 minutes.   HOSPITAL COURSE:  [name] is a 77 year old female with known coronary  artery disease.  She initially went to [facility] with chest pain  and was evaluated there by Dr. Dorethea Clan.  She was transferred to [facility] for further evaluation and treatment.   Cardiac catheterization was performed to further define her anatomy.  The  cardiac catheterization showed patent SVG to diagonal, patent SVG to PDA,  patent SVG to PLA, but the LIMA to LAD was atretic.  There was an 80% ostial  and proximal stenosis to the LAD.  The circumflex system had 30-40% stenosis  and there was a 40-50% stenosis in the  left main.  She had no significant  disease distal to her bypass grafts.  Dr. Riley Kill evaluated the films and  extent of ischemia was not clear.  He recommended a Myoview with  percutaneous intervention or intravascular ultrasound if positive.   [name] had a Myoview, which showed anterior wall scar but no ischemia  and an EF of 72%.  [name] also complained of increased reflux symptoms  and had not been taking her Prilosec recently, so this was started back.  A  lipid profile was performed, which showed total cholesterol of 99,  triglycerides 110, HDL 40, LDL 37.  She is to continue Lipitor 10.  She also  had a D-dimer that was minimally elevated at 0.69, and this was in the  setting of renal insufficiency, so its significance is unclear.  Her O2  saturation is 98% on room air.  Additionally, she had renal insufficiency on  admission with a BUN of 73  and a creatinine of 2.3.  On June 26, 2005, her  BUN was 12, creatinine was 1.0.  She had responded well to hydration.   [name]'s chest pain has resolved.  Dr. Dorethea Clan is to review the data  including the Myoview results and discuss the situation with the patient and  her husband.  If no further inpatient workup is indicated, [name] is  tentatively considered stable for discharge on June 26, 2005, with  outpatient follow-up arranged.   DISCHARGE INSTRUCTIONS:  1.  Her activity is to be increased gradually.  2.  She is to stick to a low-fat diet.  3.  She is to follow up with Dr. Dorethea Clan, and a message has been left.  She      is to follow up with Dr. Phillips Odor as scheduled.   DISCHARGE MEDICATIONS:  1.  Prilosec OTC 20 mg a day.  2.  Plavix 75 mg a day.  3.  Calcium 1200 mg a day.  4.  Levothyroxine 88 mcg daily.  5.  Lisinopril 10 mg a day.  6.  Norvasc 5 mg daily.  7.  Lipitor 10 mg q.h.s.  8.  Xanax 1 mg one0half to one tablet t.i.d./q.h.s. p.r.n.      Theodore Demark, P.A. [facility]      Vida Roller, M.D.   Electronically Signed    RB/MEDQ  D:  06/26/2005  T:  06/27/2005  Job:  [id number]

## 2010-07-15 NOTE — Discharge Summary (Signed)
NAME:  [name], [name]                          ACCOUNT NO.:  [id number]   MEDICAL RECORD NO.:  [id number]                   PATIENT TYPE:  INP   LOCATION:  2024                                 FACILITY:  [facility]   PHYSICIAN:  Jesse Sans. Wall, M.D. [facility]            DATE OF BIRTH:  18-Nov-1923   DATE OF ADMISSION:  02/22/2002  DATE OF DISCHARGE:  02/25/2002                           DISCHARGE SUMMARY - REFERRING   REASON FOR ADMISSION:  This is a 77 year old female who presented to [facility] for evaluation of chest pain.   HISTORY OF PRESENT ILLNESS:  The patient does have a history of coronary  artery disease.  She had a prior MI in 1998.  She had coronary artery bypass  graft surgery following her MI that was complicated by postoperative  pneumonia and a right lower extremity hematoma.  She had a Cardiolite in  March of 2003, that according to the patient, showed no ischemia.  She has a  history of hypothyroidism, history of hypertension, history of elevated  lipids, history of gastroesophageal reflux disease, status post  fundoplication, history of hiatal hernia, history of paroxysmal  supraventricular tachycardia.  She had TB in 1953.  She has a history of a  hammer toe repair.   ALLERGIES:  CODEINE.   SOCIAL HISTORY:  [place].  She is married.  She does not use tobacco or alcohol.   HOSPITAL COURSE:  As noted, this patient was admitted to [facility] for further evaluation of chest pain.  The patient ruled out for a  MI and she was scheduled for cardiac catheterization.   Her cardiac catheterization was performed on February 24, 2002 by Dr.  Salvadore Farber.  The patient was found to have a 30% left main.  The LAD  was a large vessel.  There was a 60-70% stenosis in the proximal portion.  There was a 70% mid-lesion. The saphenous vein graft to the diagonal was  widely patent and  supplied both the second diagonal and the LAD.  The LIMA  to the distal LAD was atretic.  The ramus intermedius was a small vessel and  was essentially normal.  The circumflex was moderate size and again  essentially normal.  The RCA was occluded in its midportion over the  saphenous vein graft to the PLV and a separate saphenous vein graft to the  PDA that were both widely patent.  There was a normal left subclavian  artery.  It was felt that the only potential territory to account for her  discomfort was the unsupplied area by the first diagonal branch.  A lesion  of  the proximal LAD appeared only moderate but was fairly diffuse.  An  Adenosine-Cardiolite was recommended to further evaluate for possible  ischemia.   The patient had an Adenosine-Cardiolite on February 25, 2002.  This was  negative for ischemia or scar.  The ejection fraction was estimated to be  70%.  Arrangements were made to discharge the patient home later that day in  stable and improved condition.   LABORATORY DATA:  On the day of discharge, a CBC revealed hemoglobin 10.7,  hematocrit 31.4, WBC 4.7 thousand, platelets 159,000.  On admission,  hemoglobin had been 11.4, hematocrit 33.7.  A chemistry profile on the day  of discharge revealed BUN 22, creatinine 1.1, potassium 4.0, glucose 108.  PT and PTT were within normal limits.  Cardiac enzymes were negative.  Stool  for occult blood was negative.  TSH was 4.301.  A lipid profile revealed  cholesterol 137, triglycerides 81, HDL 69, LDL 52.   A chest x-ray showed no evidence of acute cardiopulmonary process.   DISCHARGE MEDICATIONS:  The patient was told to continue the same  medications she was on prior to admission.  These consisted of Plavix 75 mg  q.d.  The patient does not take aspirin secondary to her history of peptic  ulcer disease.  She is on Synthroid 75 mcg q.d.; Altace 5 mg q.d.; Norvasc  10 mg q.d.; Lipitor 5 mg q.d.; multivitamin; and CO-Q 10 q.d.;  Lasix 20 mg  q.d.; Bextra 40 mg q.d.; Zantac 150 mg q.d.; Foltx q.d.; and Xanax q.d.   DISCHARGE INSTRUCTIONS:  As noted, the patient was told to continue on the  same medications she was on prior to admission.  She was told to avoid any  strenuous activity or driving for two days.  She was told to call the office  if she had any increased pain, swelling, or bleeding from her groin.  She  was scheduled to see Dr. Maisie Fus C. Wall within the next two weeks for a  postoperative visit.  She was told to see her primary care physician as  needed or as scheduled.   DISCHARGE DIAGNOSES:  1. Coronary artery disease, chest pain:  Myocardial infarction ruled out.     Cardiac catheterization this admission.  Please see results as noted     above.  Adenosine-Cardiolite this admission negative for ischemia.     Previous history of coronary artery bypass graft surgery as noted above.  2. History of hypothyroidism.  3. History of hypertension.  4. History of elevated lipids.  5. Gastroesophageal reflux disease, status post fundoplication.  6. History of hiatal hernia.  7. History of paroxysmal supraventricular tachycardia.  8. History of tuberculosis in 1953.  9. Normal left ventricular function.  10.      Mild anemia with negative stool guaiacs.     Delton See, P.A. [facility]                  Thomas C. Daleen Squibb, M.D. [facility]    DR/MEDQ  D:  02/25/2002  T:  02/25/2002  Job:  [id number]   cc:   Corrie Mckusick, M.D.  [address] [place]. [place]  [place]  [place] [address]  Fax: [phone]

## 2010-07-15 NOTE — Op Note (Signed)
NAMEANDE, THERRELL                ACCOUNT NO.:  [id number]   MEDICAL RECORD NO.:  [id number]          PATIENT TYPE:  AMB   LOCATION:  DAY                           FACILITY:  [facility]   PHYSICIAN:  Cody M. Ulice Brilliant, D.P.M.  DATE OF BIRTH:  25-Feb-1924   DATE OF PROCEDURE:  12/30/2003  DATE OF DISCHARGE:                                 OPERATIVE REPORT   PREOPERATIVE DIAGNOSIS:  Contracted hammer digit deformity second toe, left  foot.   POSTOPERATIVE DIAGNOSIS:  Contracted hammer digit deformity second toe, left  foot.   OPERATION PERFORMED:  Arthroplasty of proximal interphalangeal joint second  digit left with capsulotomy of second metatarsophalangeal joint left with  0.45 K-wire fixation.   SURGEON:  Denny Peon. Ulice Brilliant, D.P.M.   ANESTHESIA:  MAC.   INDICATIONS FOR PROCEDURE:  [name] is a 77 year old white female who has  gotten to the point because of the hammer toe of not being able to wear any  shoes except for a sandal.  All enclosed shoes are uncomfortable.  Clinical  findings include a very contracted hammer digit with an extension  contracture at the MTP and a flexion contracture at the PIPJ.  She has an  area of erythema over the proximal phalangeal head secondary to chronic  rubbing.   DESCRIPTION OF PROCEDURE:  Ms. Tiney was brought to the operating room and  placed on the table in the supine position.  IV sedation was established.  A  block was performed proximal to the intended surgical site utilizing a 1:1  mixture of lidocaine and Marcaine.  A pneumatic ankle tourniquet was then  applied across her left ankle.  Her foot was then prepped and draped in the  usual aseptic fashion.  An Ace bandage was then utilized to exsanguinate her  foot.  The tourniquet was inflated to 250 mmHg.   PROCEDURE:  Second digit arthroplasty with capsulotomy of second  metatarsophalangeal joint and K-wire fixation.  Attention was directed to  the second ray.  An approximately 7 cm incision  was made beginning at the  midshaft of the intermediate phalanx extending across the proximal  interphalangeal joint and then extending in a slight curvilinear fashion  over the metatarsophalangeal joint.  The incision was made with a #15 blade  and deepened by a combination of sharp and blunt dissection through  subcutaneous tissue.  Care was taken to avoid neural and vascular structures  which were retracted away from the surgical site.  Any crossing vessels that  could not be retracted were clamped and cauterized.  Care was taken to get  good exposure of the proximal interphalangeal joint, the extensor hood  apparatus and the second MTP.  With this dissection complete, the proximal  interphalangeal joint was entered by a dorsal transverse incision across the  extensor tendon.  The medial and lateral collateral ligaments were isolated  and severed.  The extensor tendon was retracted proximally and an  approximately 7.5 to 9 mm wedge of proximal phalangeal head was excised  utilizing a #62 blade and the oscillating saw.  An extensor hood  release was  then performed with paralleling cuts extending from distal to proximal about  the extensor tendon.  The forefoot was loaded and the toe was still noted to  be significantly contracted in extension contracture at the MTP.  A  transverse capsulotomy was made across the second MTP.  The medial and  lateral collateral ligaments were isolated and severed.  A McGlamry scoop  elevator was then introduced and utilized to free up the adhesion of the  flexor plate at the plantar aspect of the metatarsal head.  The forefoot was  then loaded and the toe was noted to be significantly more rectus.  A 0.045  K-wire was then introduced and driven from the base of the intermediate  phalanx through the distal aspect of the toe.  The toe was then held rectus  in all three body planes and the K-wire was then retrograded back through  the proximal phalanx and then  through the MTP into the second metatarsal.  The K-wire drive was removed.  The forefoot was loaded and the toe was noted  to lie in a nice rectus position.  The XiScan was then utilized to obtain an  intraoperative image and the amount of correction in the position of the toe  was deemed adequate.  The K-wire was then bent close to the second digit and  cut.  The K-wire entered the second metatarsal approximately 1.5 to 2 cm  which was appropriate. The wound was flushed with copious amounts of sterile  irrigant.  Soft tissue overlying the second MTP was then reapproximated and  closed with 4-0 Vicryl.  The wound was flushed again and then the skin was  reapproximated and closed utilizing 4-0 Prolene in an interlocking running  suture.  A pin cap was applied to the K-wire.  A postoperative injection of  Marcaine and Hexadrol was dispensed to the operative site.  A Betadine  soaked Adaptic dressing was applied across the incision.  A dry sterile  compressive dressing followed.  The tourniquet was then deflated with a  tourniquet time spanning roughly 30 minutes.   Ms. Hoe tolerated the anesthesia and procedure well.  She was taken to  day hospital without incident.  While there, a list of written instructions  was explained to her and her husband.  Because of a codeine allergy and  intolerance, a prescription for Mepergan Fortis was dispensed.  She will be  seen within one week for first postoperative visit.      CMD/MEDQ  D:  12/30/2003  T:  12/30/2003  Job:  [id number]

## 2010-07-15 NOTE — Op Note (Signed)
[place]  Patient:    [name]                        MRN: [id number] Proc. Date: 03/25/99 Adm. Date:  [id number] Attending:  Milly Jakob                           Operative Report  PREOPERATIVE DIAGNOSIS:  Hammer toe deformity of two through five toes with hallux valgus interphalangeus of the great toe.  POSTOPERATIVE DIAGNOSIS:  Hammer toe deformity of two through five toes with hallux valgus interphalangeus of the great toe.  PRINCIPAL PROCEDURE: 1. Hammer toe correction of two through four toes with biabsorbable intermedullary    fixation. 2. Aiken osteotomy in the proximal phalanx of the great toe.  SURGEON:  Harvie Junior, M.D.  ASSISTANT:  Kerby Less, P.A.  ANESTHESIA:  General.  BRIEF HISTORY:  This is a 76 year old female with a long history of having a right foot deformity with the great toe crossing over and crowding the second toe with second toe extension deformity.  She had hammer toe of two through five toes which was causing problems with shoe wear with a callosity formation, and these were fixed and not correctable.  Because of the deformities and failure of conservative care, she is brought to the operating room for correction.  DESCRIPTION OF PROCEDURE:  The patient was brought to the operating room and after adequate anesthesia was obtained with a general anesthetic, the patient was placed supine on the operating table.  The right leg was prepped and draped in the usual sterile fashion.  Following this, the OEC was brought in and the metacarpophalangeal joint of the great toe was identified.  An incision was made just medial to the extensor tendon.  This was extended down to bone and the proximal phalanx was exposed.  A wedge osteotomy was then undertaken and performed an [place] procedure, and the osteotomy was then closed.  Care was taken to leave the cortical bridge intact on the lateral  side.  A 2 mm biabsorbable pin was then placed across the fracture osteotomy for fixation and excellent fixation was achieved.  No further fixation was felt to be necessary at that point.  At this point, the bone graft from the wedge was taken and placed around the fracture site, and the periosteum was closed over the fracture site.  Attention was then turned to the second toe where the patient had previously had a PIP fusion of the DIP joint, was having a flexion deformity problem.  The DIP joint was then exposed and the cartilaginous surface was rongeured at both the proximal and distal end, and the biabsorbable pin was ______ intermedullary on both ends.  Care was  taken to relieve the volar plate and _______ the flexor tendon to allow for full extension of the DIP joint.  The biabsorbable pin was then put in place and attention was turned to the third toe where a curvilinear incision was made over the PIP joint.  The PIP joint was then exposed.  The condyle of the proximal phalanx were removed.  The cartilaginous surface of the distal phalanx was removed. The volar plate was released, as well as the flexor tendon, and an intermedullary fixation was achieved to allow for PIP fusion.  Dermodesis with the extensor tendon was then undertaken with 4-0 nylon suture after the wound  was copiously irrigated and suctioned dry.  OEC was used to make sure there was adequate fixation.  The fourth toe was then exposed in a similar fashion and a similar procedure was performed, and then the fifth toe was exposed in a similar fashion, and a similar procedure was performed.  This was essentially a PIP fusion with a dermodesis dorsally and release of the volar plate flexor tendon.  Excellent correction of the foot was achieved.  Then, the wounds were all then copiously irrigated and suctioned dry, and the remainder of the skin was closed.  Digital block was used with 0.5% Marcaine plain and  the patient was then put in a sterile compressive dressing, and put back into a boot for ambulation. DD:  03/25/99 TD:  03/27/99 Job: [id number]

## 2010-07-15 NOTE — Op Note (Signed)
NAME:  [name], [name]                          ACCOUNT NO.:  [id number]   MEDICAL RECORD NO.:  [id number]                   PATIENT TYPE:  AMB   LOCATION:  DAY                                  FACILITY:  [facility]   PHYSICIAN:  Lionel December, M.D.                 DATE OF BIRTH:  April 16, 1923   DATE OF PROCEDURE:  09/03/2002  DATE OF DISCHARGE:                                 OPERATIVE REPORT   PROCEDURE:  Total colonoscopy.   INDICATIONS FOR PROCEDURE:  [name] is a 77 year old Caucasian female  with a history of constipation who has intermittent hematochezia. She  recently had an episode where she passed a large amount of blood per rectum.  She is undergoing diagnostic colonoscopy. The procedure is reviewed with the  patient and informed consent was obtained. Details of her history are  summarized in dictated notes from August 25, 2002.   PREOP MEDICATIONS:  Demerol 30 mg IV, Versed 4 mg IV in divided dose.   INSTRUMENT:  Olympus video system   FINDINGS:  Procedure performed in endoscopy suite. The patient's vital signs  and O2 saturations were monitored during the procedure and remained stable.  The patient was placed in the left lateral decubitus position,  rectal examination performed. No abnormality noted on external or digital  exam. The scope was placed in the rectum and advanced under direct vision  into the sigmoid colon and beyond. Redundant colon with excellent prep.  Scattered diverticula were noted at the sigmoid and descending colon. The  scope was passed through the cecum which was identified by the appendiceal  orifice and the ileocecal valve. Pictures taken for the record. As the scope  was withdrawn, the colonic mucosa was once again carefully examined,  There  was also diffuse mucosal pigmentation consistent with melanosis coli which  was most pronounced in the right colon. There were no polyps or tumor  masses. The rectal mucosa similarly was normal. The scope was  retroflexed to  examine the anorectal junction and she had hemorrhoids above and below the  dentate line with some mucosal irritation at the dentate line.  The  endoscope was straightened and withdrawn. The patient tolerated the  procedure well.   FINAL DIAGNOSES:  1. Left colonic diverticulosis.  2. Melanosis coli.  3. Internal and external hemorrhoids.   Suspect recent episode of bleeding could be from the hemorrhoids or  diverticular bleed.    RECOMMENDATIONS:  She needs to be on high fiber diet, Citrucel or equivalent  one tablespoonful daily and should take 1-2 Senna tablets every night or  every other day.                                               Lionel December, M.D.  NR/MEDQ  D:  09/03/2002  T:  09/04/2002  Job:  [id number]   cc:   Corrie Mckusick, M.D.  [address]  [place]  [place] [address]  Fax: [phone]

## 2010-07-15 NOTE — H&P (Signed)
[name]                ACCOUNT NO.:  [id number]   MEDICAL RECORD NO.:  [id number]          PATIENT TYPE:  AMB   LOCATION:  DAY                           FACILITY:  [facility]   PHYSICIAN:  Cody M. Ulice Brilliant, D.P.M.  DATE OF BIRTH:  1923-09-22   DATE OF ADMISSION:  12/30/2003  DATE OF DISCHARGE:  LH                                HISTORY & PHYSICAL   HISTORY OF PRESENT ILLNESS:  This is a 77 year old white female who has a  problem with the second digit of her left foot.  She has a hammertoe of this  second digit, which has caused her basically to not wear an enclosed shoes  for the last couple of years.  She relates that the only thing she has been  able to wear basically is a sandal.  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   PAST MEDICAL HISTORY:  1.  Significant for coronary artery disease.  2.  History of hypothyroidism.  3.  History of esophageal reflux.   CURRENT MEDICATIONS:  1.  Plavix.  2.  Altace.  3.  Norvasc.  4.  Lasix.  5.  Synthroid.  6.  Zantac.   ALLERGIES:  CODEINE.   PAST SURGICAL HISTORY:  1.  Previous bypass surgery.  2.  Exploratory laparotomy, which resulted in a code blue following the      procedure immediately postop.   OBJECTIVE:  EXTREMITIES:  She has a hammer digit of the second toe with a  dorsal and extensor contracture at the MTP, and a flexion contracture at the  proximal interphalangeal joint.  She has developed a helomadurum over the  proximal interphalangeal joint dorsally.  Radiographs reveal a long second  metatarsal, and a relatively long second digit.  The above-noted deformity  is noted on radiograph.  She is noted to have very weakly palpable pedal  pulses.  Vascular study, which was done preoperatively, suggested an ABI of  0.133.   ASSESSMENT:  Painful hammer digit of second two, left foot.   PLAN:   Surgical correction will consist of a step-wise sequential reduction  of the hammer digit, likely ending with an arthroplasty of the second toe,  capsulotomy of the second MTP, and K wire fixation.  I have discussed the  procedure with [name].  She has read her consent form, apparently  understood this, and signed.      CMD/MEDQ  D:  12/28/2003  T:  12/28/2003  Job:  [id number]

## 2010-07-15 NOTE — H&P (Signed)
[name], [name]                ACCOUNT NO.:  [id number]   MEDICAL RECORD NO.:  [id number]           PATIENT TYPE:  AMB   LOCATION:                                FACILITY:  [facility]   PHYSICIAN:  Lionel December, M.D.    DATE OF BIRTH:  1923/07/14   DATE OF ADMISSION:  11/24/2004  DATE OF DISCHARGE:  LH                                HISTORY & PHYSICAL   CHIEF COMPLAINT:  Abnormal CT with soft tissue density at the cardia and  chronic hematochezia.   HISTORY OF PRESENT ILLNESS:  [name] is a 77 year old, Caucasian female  patient of Dr. Phillips Odor who is there for scheduled visit.  She has been on  chronic NSAID therapy for generalized osteoarthritis.  Recently she  experienced epigastric pain.  She therefore had abdominopelvic CT.  It  revealed prominent mucosa at the GE junction versus soft tissue.  Dr.  Lezlie Lye who read the study said mass could not be fluid.  She also had  changes of prior to surgery as well as evidence of cholecystectomy.  The  patient's NSAID was changed.  She is not having epigastric pain anymore.  There is no history of nausea, vomiting, dysphagia, heartburn or melena,  however, she continues to experience rectal bleeding virtually every day.  She called our office 4 weeks ago and we suggested trying Anusol  suppository, but it has not made any difference.  Now she states that she  has been bleeding for the last 2 years.  She has blood with virtually every  bowel movement and is always fresh blood, small in amount.  At times, she  will noticed blood on her under clothes.  She denies diarrhea or  constipation.  Her appetite is good and she has not lost any weight  recently.   [name] underwent colonoscopy in July 2004, at which time she was complaining  of hematochezia and constipation.  She had melanosis coli, internal and  external hemorrhoids as well as left colonic diverticulosis.   MEDICATIONS:  1.  Lasix 20 mg daily.  2.  Norvasc 5 mg daily.  3.  Altace 5 mg  daily.  4.  Plavix 75 mg daily.  5.  Synthroid 88 mcg daily.  6.  Lipitor 5 mg daily.  7.  Zantac p.r.n.  8.  Vitamin D daily.  9.  Calcium 1.5 g daily.  10. Centrum Silver daily.  11. Kay Ciel question dose daily.  12. Senna p.r.n.   PAST MEDICAL HISTORY:  1.  History of GERD.  2.  Status post open Nissen fundoplication.  3.  Vagotomy and pyloroplasty in 1982.  4.  EGD in January 1991, revealing mild changes of reflux esophagitis,      evidence of erosive gastritis and bulbar ulcer.  5.  H. pylori status is unknown.  6.  Coronary artery disease with MI in 1998.  She is status post CABG.  7.  Hypothyroidism.  8.  Hypercholesterolemia.  9.  Remote history of TB.  10. Diagnostic laparoscopy in 1990, resulting in small bowel perforation and  abscess.  11. Cholecystectomy in 1989.  12. Presacral neurectomy in 1960.   ALLERGIES:  CODEINE and ASPIRIN.   FAMILY HISTORY:  Negative for colon carcinoma, but positive for other  malignancies.  Mother died of stomach cancer and one sister died of brain  cancer.   SOCIAL HISTORY:  She is retired.  She has two daughters.  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  She does not smoke cigarettes or drink alcohol.   OBJECTIVE:  VITAL SIGNS:  Weight 156.5 pounds, height 5 feet 6 inches tall,  pulse 76 per minute, blood pressure 140/72, temperature 97.9.  HEENT:  Conjunctivae is pink.  Sclerae nonicteric.  Oropharyngeal mucosa is  normal.  She has dentures.  NECK:  No neck masses or thyromegaly noted.  HEART:  Within normal limits.  ABDOMEN:  Symmetrical and soft without tenderness, organomegaly or masses.  RECTAL:  Two small skin tags.  Digital exam is normal.  There was a scant  amount of blood on the gloved finger.  EXTREMITIES:  No peripheral edema or clubbing noted.   LABORATORY DATA AND X-RAY FINDINGS:  CT is available for review.  There is  soft tissue density at the cardia, most likely due to a wrap, but mass  cannot be ruled  out.   ASSESSMENT:  1.  Chronic hematochezia.  I suspect this is secondary to hemorrhoids.  She      has been keeping a written record and she has had bleeding virtually      every day since August 28.  Last colonoscopy was July 2004.  I feel      rectum and sigmoid colon needs to be examined to make sure she does not      have proctitis or other lesions.  2.  Intermittent epigastric pain possibly due to nonsteroidal anti-      inflammatory drug therapy with abnormal computed tomography with      abnormality at cardia.   PLAN:  1.  Suppository one per rectum at bedtime x4 weeks.  2.  Esophagogastroduodenoscopy along with flexible sigmoidoscopy to be      performed in the next couple of weeks.  3.  Start taking Prilosec 20 mg p.o. q.a.m. while on NSAID therapy as it      would reduce the risk of peptic ulcer      disease for at least the first year.  Prescription given for 30 with 11      refills.  4.  She will also start Fiber Choice two tablets daily.  We will check her      H&H at the time of endoscopy evaluation.      Lionel December, M.D.  Electronically Signed     NR/MEDQ  D:  11/24/2004  T:  11/24/2004  Job:  [id number]   cc:   Corrie Mckusick, M.D.  Fax: [phone]

## 2010-07-18 ENCOUNTER — Encounter: Payer: Self-pay | Admitting: Cardiovascular Disease

## 2010-07-18 ENCOUNTER — Telehealth: Payer: Self-pay | Admitting: Cardiovascular Disease

## 2010-07-18 DIAGNOSIS — I10 Essential (primary) hypertension: Secondary | ICD-10-CM

## 2010-07-18 MED ORDER — RAMIPRIL 5 MG PO CAPS: 5.0000 mg | ORAL_CAPSULE | Freq: Every day | ORAL | Status: DC

## 2010-07-18 NOTE — Telephone Encounter (Signed)
Spoke with pt. to clarify the B/P medication she took this week end. According to patient she took Altace 20 mg 2 tablets on May 19 th her husband's medication, not Atacand as stated on previous note. Patient also took 2/ 50 mg of Metoprolol on May 20 th. Dr. Shirlee Latch DOD recommends for pt to start Altace 5 mg once a day. Pt. To have BMET lab done  in 5 to 7 days. Patient would like to have a prescription mail to her home for BMET to be done at her PCP's office . Patient has an appointment with Dr. [name] on 08/09/10. A prescription send to [facility] pharmacy for Altace 5 mg once a day. Patient aware.

## 2010-07-18 NOTE — Telephone Encounter (Signed)
Pt had carotid doppler done on 5/18. B/p today 198/73. In left arm. Also ankle are swollen.   B/p reading 5/18  @ 5:30 172/84 5/18 @ 5:35 187/83  5/18 @ 8 a.m. 150/47 in left arm. 5/18 @ 8 a.m. 150/43 in right arm.  5/21 @ 7:45 161/52 in left arm

## 2010-07-18 NOTE — Telephone Encounter (Signed)
Patient states had a Carotid doppler done last week  on 07/15/10. Her B/B was  198/73. According to pt. the technician at Vascular lab, recommended for pt. To hake her B/P during the weekend, and call the office with the reading. Today at 08:15 Am pt's B/P was 173/61 on right arm and 160/60 on left arm. According to patient her B/P was up to 192/ 62 during the weekend. She took HER HUSBAND  ATACAR 20 MG MEDICATION AND 50 MG  Metoprolol medication and her B/P continued to be high. Patient states has ankle edema which she has started taken Furosemide 20 mg twice a day started yesterday AM. Patient has take  The Furosemide 20 mg this AM. Her ankle edema is better. Patient takes her Metoprolol in the evenings.

## 2010-07-25 ENCOUNTER — Other Ambulatory Visit: Payer: Self-pay | Admitting: Cardiology

## 2010-07-25 LAB — BASIC METABOLIC PANEL
BUN: 45 mg/dL — ABNORMAL HIGH (ref 6–23)
CO2: 30 mEq/L (ref 19–32)
Calcium: 10.1 mg/dL (ref 8.4–10.5)
Chloride: 102 mEq/L (ref 96–112)
Creat: 1.39 mg/dL — ABNORMAL HIGH (ref 0.40–1.20)

## 2010-07-26 ENCOUNTER — Other Ambulatory Visit: Payer: Self-pay | Admitting: Cardiovascular Disease

## 2010-07-26 LAB — BASIC METABOLIC PANEL
BUN: 44 mg/dL — ABNORMAL HIGH (ref 6–23)
Creat: 1.21 mg/dL — ABNORMAL HIGH (ref 0.40–1.20)

## 2010-07-28 ENCOUNTER — Telehealth: Payer: Self-pay | Admitting: Cardiovascular Disease

## 2010-07-28 ENCOUNTER — Encounter: Payer: Self-pay | Admitting: Cardiovascular Disease

## 2010-07-28 NOTE — Telephone Encounter (Signed)
Pt needs to know what her potassium level is because it was high and her pharm MD said she should not be of lisinopril

## 2010-07-28 NOTE — Telephone Encounter (Signed)
Pt aware of repeat potassium level.  Potassium 4.8 on 07/26/10.

## 2010-08-08 ENCOUNTER — Encounter: Payer: Self-pay | Admitting: Cardiovascular Disease

## 2010-08-09 ENCOUNTER — Encounter: Payer: Self-pay | Admitting: Cardiovascular Disease

## 2010-08-09 ENCOUNTER — Ambulatory Visit (INDEPENDENT_AMBULATORY_CARE_PROVIDER_SITE_OTHER): Payer: Medicare Other | Admitting: Cardiovascular Disease

## 2010-08-09 VITALS — BP 144/48 | HR 53

## 2010-08-09 DIAGNOSIS — I2510 Atherosclerotic heart disease of native coronary artery without angina pectoris: Secondary | ICD-10-CM

## 2010-08-09 DIAGNOSIS — I4891 Unspecified atrial fibrillation: Secondary | ICD-10-CM

## 2010-08-09 DIAGNOSIS — I10 Essential (primary) hypertension: Secondary | ICD-10-CM

## 2010-08-09 NOTE — Progress Notes (Signed)
HPI:  This is an 77 year old woman presenting for follow up evaluation. The patient is followed for paroxysmal atrial fibrillation, CAD status post CABG, and hypertension. She had developed severe hypertension with systolic readings above 200 mm mercury. She has been treated with low-dose ACE inhibitor and beta blocker and has responded well to therapy. Her home readings have been in the 130s over 80s. She denies lightheadedness or syncope. The patient denies chest pain, dyspnea, orthopnea, or PND. She admits to occasional edema. She has had hyperkalemia on her lab testing but when she returns for repeat labs within 24 hours the second set of lab work has come back normal. Because of this I have suspected hemolysis.  Outpatient Encounter Prescriptions as of 08/09/2010  Medication Sig Dispense Refill  . ALPRAZolam (XANAX) 1 MG tablet Take 1.5 mg by mouth daily.        Marland Kitchen aspirin EC 81 MG EC tablet Take 1 tablet (81 mg total) by mouth daily.      . Biotin 5000 MCG CAPS Take by mouth daily.        . Calcium Carbonate-Vitamin D (CALCIUM 600+D) 600-200 MG-UNIT TABS Take by mouth daily.        . celecoxib (CELEBREX) 200 MG capsule Take 200 mg by mouth daily.        . Cholecalciferol (VITAMIN D3) 1000 UNITS tablet Take 1,000 Units by mouth daily.        . cyclobenzaprine (FLEXERIL) 10 MG tablet Take 5 mg by mouth at bedtime.        . diclofenac sodium (VOLTAREN) 1 % GEL Apply topically 2 (two) times daily.        Marland Kitchen GELATIN PO Take 1,300 mg by mouth daily.        Marland Kitchen levothyroxine (SYNTHROID, LEVOTHROID) 88 MCG tablet Take 88 mcg by mouth daily.        . metoprolol (TOPROL-XL) 50 MG 24 hr tablet Take 1 tablet by mouth Daily.      Marland Kitchen omeprazole (PRILOSEC) 20 MG capsule Take 1 tablet by mouth Daily.      . ramipril (ALTACE) 5 MG capsule Take 1 capsule (5 mg total) by mouth daily.  30 capsule  6  . DISCONTD: acetaminophen (TYLENOL) 325 MG tablet Take 650 mg by mouth every 6 (six) hours as needed.        Marland Kitchen  DISCONTD: citalopram (CELEXA) 20 MG tablet Take 20 mg by mouth daily.        Marland Kitchen DISCONTD: Methylsulfonylmethane (MSM) 1000 MG CAPS Take 2 capsules by mouth daily.        Marland Kitchen DISCONTD: omeprazole (PRILOSEC OTC) 20 MG tablet Take 20 mg by mouth as needed.        Marland Kitchen DISCONTD: senna (SENOKOT) 8.6 MG tablet Take 1 tablet by mouth as needed.        Marland Kitchen DISCONTD: vitamin E 1000 UNIT capsule Take 1,000 Units by mouth daily.          Allergies  Allergen Reactions  . Codeine     REACTION: Nausea  . Tramadol     REACTION: sick stomach    Past Medical History  Diagnosis Date  . CAD (coronary artery disease)   . Carotid artery disease   . Atrial fibrillation   . Old myocardial infarction   . HTN (hypertension)   . Hyperlipidemia   . Unspecified arthropathy, other unspecified sites   . Sacroiliac joint dysfunction   . Spinal stenosis   . Anemia  iron deficiency  . Hypercalcemia   . Osteoarthrosis, unspecified whether generalized or localized, ankle and foot   . Tibialis tendinitis   . Benign paroxysmal positional vertigo   . Acute sinusitis, unspecified   . Prerenal azotemia     mild  . Anemia, macrocytic   . Nausea with vomiting   . Other malaise and fatigue   . Abrasion or friction burn of foot and toe(s), without mention of infection   . Raynaud's syndrome   . Nonspecific abnormal results of liver function study   . Nonspecific abnormal electrocardiogram (ECG) (EKG)   . Knee pain   . Impingement syndrome of shoulder region     unspecified area  . Osteoporosis, unspecified   . Shoulder pain, bilateral   . Chronic low back pain   . Arthritis   . Constipation   . PUD (peptic ulcer disease)   . Depression   . Anxiety   . Allergic rhinitis     ROS: Negative except as per HPI  BP 144/48  Pulse 53  PHYSICAL EXAM: Pt is alert and oriented, early woman in NAD HEENT: normal Neck: JVP - normal, carotids 2+= without bruits Lungs: CTA bilaterally CV: RRR without murmur or  gallop Abd: soft, NT, Positive BS, no hepatomegaly Ext: no C/C/E, distal pulses intact and equal Skin: warm/dry no rash. Lower extremity varicosities and spider veins noted.  EKG:  Normal sinus rhythm with sinus arrhythmia, heart rate 64 beats per minute, within normal limits  ASSESSMENT AND PLAN:

## 2010-08-09 NOTE — Patient Instructions (Signed)
Your physician wants you to follow-up in: 1 YEAR.  You will receive a reminder letter in the mail two months in advance. If you don't receive a letter, please call our office to schedule the follow-up appointment.  Your physician recommends that you continue on your current medications as directed. Please refer to the Current Medication list given to you today.  Your physician recommends that you have lab work in: BMP in 1 MONTH (order given to patient)

## 2010-08-09 NOTE — Assessment & Plan Note (Signed)
The patient's bypass surgery was in 1998. She underwent catheterization in 2007 demonstrating graft patency. She is doing well without exertional angina at present. Recommend continue current medical program.

## 2010-08-09 NOTE — Assessment & Plan Note (Signed)
The patient is maintaining sinus rhythm.

## 2010-08-09 NOTE — Assessment & Plan Note (Signed)
Blood pressure is much better controlled. Would continue current medical program. She should have her potassium checked again in about one month for followup.

## 2010-09-07 ENCOUNTER — Other Ambulatory Visit: Payer: Self-pay | Admitting: Cardiovascular Disease

## 2010-09-08 ENCOUNTER — Encounter: Payer: Self-pay | Admitting: Cardiovascular Disease

## 2010-09-08 LAB — BASIC METABOLIC PANEL
Chloride: 102 mEq/L (ref 96–112)
Potassium: 4.9 mEq/L (ref 3.5–5.3)
Sodium: 139 mEq/L (ref 135–145)

## 2010-09-22 ENCOUNTER — Other Ambulatory Visit: Payer: Self-pay | Admitting: *Deleted

## 2010-09-27 ENCOUNTER — Telehealth: Payer: Self-pay | Admitting: Cardiovascular Disease

## 2010-09-27 NOTE — Telephone Encounter (Signed)
Spoke with pt who reports Dr. [name] called her and gave her instructions to stop lasix and celebrex and have follow up lab work in one month.  I reviewed chart and note from Dr. [name] indicates pt to stop above meds (which pt has done) and schedule an appt. With her PCP --Dr. Margo Aye- for further eval.  I gave pt this information and she will call Dr. Margo Aye to schedule appt.  I told pt I would fax most recent lab work to Dr. Scharlene Gloss office.

## 2010-09-27 NOTE — Telephone Encounter (Signed)
Per pt call, pt was told she needs to get blood work w/i a month. Pt continued to say she needed a prescription to get blood work. Please return pt call to advise/discuss.

## 2010-09-28 ENCOUNTER — Other Ambulatory Visit: Payer: Self-pay | Admitting: Cardiovascular Disease

## 2010-10-12 ENCOUNTER — Encounter: Payer: Self-pay | Admitting: Cardiovascular Disease

## 2010-10-14 ENCOUNTER — Telehealth: Payer: Self-pay | Admitting: *Deleted

## 2010-10-14 NOTE — Telephone Encounter (Signed)
CALLED PT RE RECENT LABS THAT WERE DONE AT  [facility]  K WAS 5.5 NOT ON SUPPLEMENT INSTRUCTED FOR PT TO  DECREASE DIETARY INTAKE AND  ALSO  BUN WAS 36  STABLE WAS 44 AND 45  BACK IN 5 -12 PT  AWARE  WILL FORWARD TO DR [name] FOR REVIEW .Zack Seal

## 2010-11-18 ENCOUNTER — Other Ambulatory Visit (HOSPITAL_COMMUNITY): Payer: Self-pay | Admitting: Internal Medicine

## 2010-11-18 DIAGNOSIS — Z139 Encounter for screening, unspecified: Secondary | ICD-10-CM

## 2010-12-02 ENCOUNTER — Other Ambulatory Visit: Payer: Self-pay | Admitting: *Deleted

## 2010-12-02 MED ORDER — METOPROLOL SUCCINATE ER 50 MG PO TB24: 50.0000 mg | ORAL_TABLET | Freq: Every day | ORAL | Status: DC

## 2010-12-12 LAB — BASIC METABOLIC PANEL
BUN: 14
BUN: 22
BUN: 31 — ABNORMAL HIGH
CO2: 18 — ABNORMAL LOW
CO2: 24
CO2: 26
CO2: 30
CO2: 32
Calcium: 7.5 — ABNORMAL LOW
Calcium: 7.6 — ABNORMAL LOW
Calcium: 7.6 — ABNORMAL LOW
Calcium: 7.8 — ABNORMAL LOW
Calcium: 7.8 — ABNORMAL LOW
Calcium: 7.9 — ABNORMAL LOW
Chloride: 109
Chloride: 104
Chloride: 104
Chloride: 109
Chloride: 109
Creatinine, Ser: 0.92
Creatinine, Ser: 0.95
Creatinine, Ser: 1
Creatinine, Ser: 1.25 — ABNORMAL HIGH
GFR calc Af Amer: 46 — ABNORMAL LOW
GFR calc Af Amer: 55 — ABNORMAL LOW
GFR calc Af Amer: >60
GFR calc Af Amer: >60
GFR calc Af Amer: >60
GFR calc Af Amer: >60
GFR calc non Af Amer: 41 — ABNORMAL LOW
GFR calc non Af Amer: 47 — ABNORMAL LOW
Glucose, Bld: 83
Glucose, Bld: 111 — ABNORMAL HIGH
Glucose, Bld: 118 — ABNORMAL HIGH
Glucose, Bld: 155 — ABNORMAL HIGH
Potassium: 4.1
Potassium: 2.7 — CL
Potassium: 3.6
Potassium: 3.9
Potassium: 4.4
Potassium: 4.5
Potassium: 4.5
Sodium: 135
Sodium: 137
Sodium: 139
Sodium: 141
Sodium: 142

## 2010-12-12 LAB — PROTEIN ELECTROPH W RFLX QUANT IMMUNOGLOBULINS
Albumin ELP: 46.1 — ABNORMAL LOW
Alpha-1-Globulin: 11.7 — ABNORMAL HIGH
Beta 2: 3.5
Beta Globulin: 5.8
Total Protein ELP: 4.5 — ABNORMAL LOW

## 2010-12-12 LAB — CBC
HCT: 28.8 — ABNORMAL LOW
HCT: 26.8 — ABNORMAL LOW
HCT: 28.3 — ABNORMAL LOW
HCT: 28.3 — ABNORMAL LOW
HCT: 30 — ABNORMAL LOW
HCT: 32.1 — ABNORMAL LOW
HCT: 32.1 — ABNORMAL LOW
Hemoglobin: 9.6 — ABNORMAL LOW
Hemoglobin: 10.2 — ABNORMAL LOW
Hemoglobin: 10.9 — ABNORMAL LOW
Hemoglobin: 8.9 — ABNORMAL LOW
Hemoglobin: 9.4 — ABNORMAL LOW
MCHC: 33.5
MCHC: 33.1
MCHC: 33.3
MCHC: 33.6
MCHC: 33.7
MCHC: 33.7
MCHC: 33.8
MCHC: 34
MCV: 98
MCV: 95.7
MCV: 96.4
MCV: 96.9
MCV: 97.5
Platelets: 521 — ABNORMAL HIGH
Platelets: 222
Platelets: 270
Platelets: 323
Platelets: 396
RBC: 2.94 — ABNORMAL LOW
RBC: 2.74 — ABNORMAL LOW
RBC: 2.9 — ABNORMAL LOW
RBC: 2.91 — ABNORMAL LOW
RBC: 3.11 — ABNORMAL LOW
RBC: 3.15 — ABNORMAL LOW
RBC: 3.32 — ABNORMAL LOW
RBC: 3.54 — ABNORMAL LOW
RDW: 13.3
RDW: 13.8
RDW: 14.5 — ABNORMAL HIGH
RDW: 14.5 — ABNORMAL HIGH
WBC: 7.7
WBC: 10.4
WBC: 11.2 — ABNORMAL HIGH
WBC: 12.5 — ABNORMAL HIGH
WBC: 6
WBC: 6.4
WBC: 7.9

## 2010-12-12 LAB — CROSSMATCH: DAT, IgG: NEGATIVE

## 2010-12-12 LAB — DIFFERENTIAL
Basophils Absolute: 0.1
Basophils Relative: 0
Eosinophils Relative: 1
Eosinophils Relative: 4
Lymphocytes Relative: 15
Monocytes Absolute: 0.5
Monocytes Absolute: 0.6
Monocytes Relative: 4
Neutro Abs: 10.7 — ABNORMAL HIGH

## 2010-12-12 LAB — OCCULT BLOOD X 1 CARD TO LAB, STOOL
Fecal Occult Bld: POSITIVE
Fecal Occult Bld: POSITIVE

## 2010-12-12 LAB — HEPATIC FUNCTION PANEL
ALT: 16
AST: 16
Albumin: 1.7 — ABNORMAL LOW
Total Protein: 4.6 — ABNORMAL LOW

## 2010-12-12 LAB — COMPREHENSIVE METABOLIC PANEL
AST: 19
Albumin: 2.9 — ABNORMAL LOW
Alkaline Phosphatase: 79
Chloride: 102
Creatinine, Ser: 1.76 — ABNORMAL HIGH
GFR calc Af Amer: 34 — ABNORMAL LOW
Potassium: 5.5 — ABNORMAL HIGH
Total Bilirubin: 0.4

## 2010-12-12 LAB — CULTURE, BLOOD (ROUTINE X 2): Culture: NO GROWTH

## 2010-12-12 LAB — PROTIME-INR
INR: 1.2
INR: 1.2
INR: 1.3
INR: 2 — ABNORMAL HIGH
INR: 2 — ABNORMAL HIGH
Prothrombin Time: 16.2 — ABNORMAL HIGH
Prothrombin Time: 23.8 — ABNORMAL HIGH
Prothrombin Time: 26.2 — ABNORMAL HIGH

## 2010-12-12 LAB — URINALYSIS, ROUTINE W REFLEX MICROSCOPIC
Nitrite: NEGATIVE
Specific Gravity, Urine: 1.016
Urobilinogen, UA: 0.2

## 2010-12-12 LAB — HEPARIN LEVEL (UNFRACTIONATED): Heparin Unfractionated: 0.28 — ABNORMAL LOW

## 2010-12-12 LAB — CARDIAC PANEL(CRET KIN+CKTOT+MB+TROPI)
CK, MB: 5.1 — ABNORMAL HIGH
Relative Index: INVALID
Total CK: 46
Troponin I: 0.12 — ABNORMAL HIGH

## 2010-12-12 LAB — POCT CARDIAC MARKERS
CKMB, poc: <1 — ABNORMAL LOW
Operator id: 217071
Troponin i, poc: <0.05

## 2010-12-12 LAB — URINE CULTURE

## 2010-12-12 LAB — B-NATRIURETIC PEPTIDE (CONVERTED LAB): Pro B Natriuretic peptide (BNP): 919 — ABNORMAL HIGH

## 2010-12-12 LAB — AMYLASE: Amylase: 32

## 2010-12-13 LAB — CULTURE, BLOOD (ROUTINE X 2)
Culture: NO GROWTH
Culture: NO GROWTH
Report Status: 7172008
Report Status: 7172008

## 2010-12-13 LAB — URINALYSIS, ROUTINE W REFLEX MICROSCOPIC
Glucose, UA: NEGATIVE
Glucose, UA: NEGATIVE
Hgb urine dipstick: NEGATIVE
Ketones, ur: 15 — AB
Nitrite: NEGATIVE
Nitrite: NEGATIVE
Protein, ur: 30 — AB
Protein, ur: NEGATIVE
Specific Gravity, Urine: 1.025
Specific Gravity, Urine: >1.03 — ABNORMAL HIGH
Urobilinogen, UA: 0.2
Urobilinogen, UA: 0.2
pH: 5
pH: 5

## 2010-12-13 LAB — COMPREHENSIVE METABOLIC PANEL
AST: 32
Albumin: 3.6
Calcium: 9.3
Chloride: 110
Creatinine, Ser: 1.48 — ABNORMAL HIGH
GFR calc Af Amer: 41 — ABNORMAL LOW
Total Protein: 6.6

## 2010-12-13 LAB — BASIC METABOLIC PANEL
BUN: 29 — ABNORMAL HIGH
BUN: 38 — ABNORMAL HIGH
BUN: 39 — ABNORMAL HIGH
CO2: 19
CO2: 20
Calcium: 6.8 — ABNORMAL LOW
Calcium: 6.9 — ABNORMAL LOW
Chloride: 112
Chloride: 113 — ABNORMAL HIGH
Chloride: 114 — ABNORMAL HIGH
Creatinine, Ser: 1.38 — ABNORMAL HIGH
Creatinine, Ser: 1.4 — ABNORMAL HIGH
Creatinine, Ser: 1.53 — ABNORMAL HIGH
GFR calc Af Amer: 44 — ABNORMAL LOW
GFR calc Af Amer: 45 — ABNORMAL LOW
GFR calc non Af Amer: 33 — ABNORMAL LOW
GFR calc non Af Amer: 36 — ABNORMAL LOW
GFR calc non Af Amer: 37 — ABNORMAL LOW
Glucose, Bld: 123 — ABNORMAL HIGH
Glucose, Bld: 127 — ABNORMAL HIGH
Glucose, Bld: 176 — ABNORMAL HIGH
Potassium: 4.4
Potassium: 4.6
Potassium: 4.7
Sodium: 138
Sodium: 139

## 2010-12-13 LAB — URINE MICROSCOPIC-ADD ON

## 2010-12-13 LAB — CBC
HCT: 33.7 — ABNORMAL LOW
HCT: 35.3 — ABNORMAL LOW
Hemoglobin: 11.3 — ABNORMAL LOW
Hemoglobin: 11.8 — ABNORMAL LOW
MCHC: 33.4
MCHC: 33.5
MCV: 96.7
MCV: 97.6
MCV: 97.7
Platelets: 190
Platelets: 234
Platelets: 253
RBC: 3.45 — ABNORMAL LOW
RBC: 3.62 — ABNORMAL LOW
RDW: 13.4
RDW: 13.5
RDW: 13.8
WBC: 10.8 — ABNORMAL HIGH
WBC: 11.9 — ABNORMAL HIGH
WBC: 8.4

## 2010-12-13 LAB — CREATININE, SERUM
Creatinine, Ser: 1.14
GFR calc Af Amer: 55 — ABNORMAL LOW
GFR calc non Af Amer: 46 — ABNORMAL LOW

## 2010-12-13 LAB — DIFFERENTIAL
Basophils Absolute: 0
Basophils Absolute: 0
Basophils Relative: 0
Basophils Relative: 0
Eosinophils Absolute: 0
Eosinophils Absolute: 0
Eosinophils Relative: 0
Eosinophils Relative: 0
Eosinophils Relative: 1
Lymphocytes Relative: 2 — ABNORMAL LOW
Lymphocytes Relative: 3 — ABNORMAL LOW
Lymphocytes Relative: 6 — ABNORMAL LOW
Lymphs Abs: 0.2 — ABNORMAL LOW
Lymphs Abs: 0.3 — ABNORMAL LOW
Lymphs Abs: 0.7
Monocytes Absolute: 0.2
Monocytes Absolute: 0.5
Monocytes Absolute: 0.6
Monocytes Relative: 2 — ABNORMAL LOW
Monocytes Relative: 5
Monocytes Relative: 6
Neutro Abs: 10 — ABNORMAL HIGH
Neutro Abs: 10.9 — ABNORMAL HIGH
Neutro Abs: 7.7
Neutrophils Relative %: 91 — ABNORMAL HIGH
Neutrophils Relative %: 93 — ABNORMAL HIGH

## 2010-12-13 LAB — CARDIAC PANEL(CRET KIN+CKTOT+MB+TROPI)
CK, MB: 4
Relative Index: INVALID
Total CK: 75
Troponin I: 0.08 — ABNORMAL HIGH

## 2010-12-13 LAB — PHOSPHORUS: Phosphorus: 6.1 — ABNORMAL HIGH

## 2010-12-13 LAB — URINE CULTURE
Colony Count: NO GROWTH
Culture: NO GROWTH
Special Requests: NEGATIVE

## 2010-12-13 LAB — TSH: TSH: 1.036

## 2010-12-13 LAB — MAGNESIUM: Magnesium: 2.4

## 2010-12-13 LAB — B-NATRIURETIC PEPTIDE (CONVERTED LAB): Pro B Natriuretic peptide (BNP): 178 — ABNORMAL HIGH

## 2010-12-13 LAB — LIPASE, BLOOD: Lipase: 24

## 2010-12-13 LAB — T4, FREE: Free T4: 0.95

## 2011-01-02 ENCOUNTER — Ambulatory Visit (HOSPITAL_COMMUNITY)
Admission: RE | Admit: 2011-01-02 | Discharge: 2011-01-02 | Disposition: A | Discharge status: Home / Self Care | Payer: Medicare Other | Source: Ambulatory Visit | Attending: Internal Medicine | Admitting: Internal Medicine

## 2011-01-02 DIAGNOSIS — Z139 Encounter for screening, unspecified: Secondary | ICD-10-CM

## 2011-01-02 DIAGNOSIS — Z1231 Encounter for screening mammogram for malignant neoplasm of breast: Secondary | ICD-10-CM | POA: Insufficient documentation

## 2011-02-06 ENCOUNTER — Other Ambulatory Visit: Payer: Self-pay | Admitting: Cardiology

## 2011-03-07 ENCOUNTER — Other Ambulatory Visit: Payer: Self-pay | Admitting: *Deleted

## 2011-03-07 MED ORDER — RAMIPRIL 5 MG PO CAPS: 5.0000 mg | ORAL_CAPSULE | Freq: Every day | ORAL | Status: DC

## 2011-05-01 ENCOUNTER — Other Ambulatory Visit (INDEPENDENT_AMBULATORY_CARE_PROVIDER_SITE_OTHER): Payer: Self-pay | Admitting: *Deleted

## 2011-05-01 NOTE — Telephone Encounter (Signed)
[facility] has sent in a refill request for Omeprazole 20 mg capsule, take 1 capsule by mouth twice a day.

## 2011-05-02 MED ORDER — OMEPRAZOLE 20 MG PO CPDR: 20.0000 mg | DELAYED_RELEASE_CAPSULE | Freq: Two times a day (BID) | ORAL | Status: DC

## 2011-06-06 ENCOUNTER — Encounter: Payer: Self-pay | Admitting: Cardiovascular Disease

## 2011-06-08 ENCOUNTER — Encounter: Payer: Self-pay | Admitting: Nurse Practitioner

## 2011-06-08 ENCOUNTER — Ambulatory Visit (INDEPENDENT_AMBULATORY_CARE_PROVIDER_SITE_OTHER): Payer: Medicare Other | Admitting: Nurse Practitioner

## 2011-06-08 VITALS — BP 120/60 | HR 57 | Ht 67.0 in | Wt 135.0 lb

## 2011-06-08 DIAGNOSIS — I10 Essential (primary) hypertension: Secondary | ICD-10-CM

## 2011-06-08 DIAGNOSIS — I480 Paroxysmal atrial fibrillation: Secondary | ICD-10-CM

## 2011-06-08 DIAGNOSIS — R5383 Other fatigue: Secondary | ICD-10-CM

## 2011-06-08 DIAGNOSIS — I2510 Atherosclerotic heart disease of native coronary artery without angina pectoris: Secondary | ICD-10-CM

## 2011-06-08 DIAGNOSIS — I4891 Unspecified atrial fibrillation: Secondary | ICD-10-CM

## 2011-06-08 DIAGNOSIS — E785 Hyperlipidemia, unspecified: Secondary | ICD-10-CM

## 2011-06-08 MED ORDER — NITROGLYCERIN 0.4 MG SL SUBL: 0.4000 mg | SUBLINGUAL_TABLET | SUBLINGUAL | Status: AC | PRN

## 2011-06-08 NOTE — Progress Notes (Signed)
Patient Name: [name] Date of Encounter: 06/08/2011  Primary Care Provider:  Dwana Melena, MD, MD Primary Cardiologist:  Judie Petit. [name], MD  Patient Profile  78 year old femalewith history of CAD who presents with secondary to fatigue.  Problem List   Past Medical History  Diagnosis Date  . CAD (coronary artery disease)     a.  s/p MI;  b. s/p CABG x 4 - LIMA->LAD, VG->DIAG, VG->PLA, VG->PDA;  c. Cath 05/2011 - LM 50-60%,  severe LAD/RCA dzs,  LIMA atretic, VG's patent;  d. Myoview 05/2005 (done after cath) EF 72%, no ischemia, fixed ant thinning.  . Carotid artery disease   . Paroxysmal a-fib     a. no longer on coumadin 2/2 h/o falls.  Marland Kitchen HTN (hypertension)   . Hyperlipidemia   . Arthropathy, unspecified, other specified sites   . Sacroiliac joint dysfunction   . Spinal stenosis   . Anemia     iron deficiency  . Hypercalcemia   . Osteoarthrosis, unspecified whether generalized or localized, ankle and foot   . Tibialis tendinitis   . Benign paroxysmal positional vertigo   . Prerenal azotemia     mild  . Anemia, macrocytic   . Raynaud's syndrome   . Impingement syndrome of shoulder region     unspecified area  . Osteoporosis, unspecified   . Shoulder pain, bilateral   . Chronic low back pain   . Arthritis   . Constipation   . PUD (peptic ulcer disease)   . Depression   . Anxiety   . Allergic rhinitis   . Fatigue    Past Surgical History  Procedure Date  . Appendectomy   . Cholecystectomy   . Laparotomy     exploratory  . Coronary artery bypass graft     4 grafts - 1998  . Hiatal hernia repair   . Roux-n-y   . Cataract extraction     Allergies  Allergies  Allergen Reactions  . Codeine     REACTION: Nausea  . Tramadol     REACTION: sick stomach    HPI  78 year old female with the above problem list.  She has a history of CAD status post CABG and by her report has been pretty stable over the past few years.  She does report an occasional "chest  cramp," occurs a few times per year, lasts a short period of time and resolved spontaneously.  She doesn't usually take nitroglycerin for this.  She is fairly active at home stating that she is able do all of her housework, vacuuming her entire house without resting and without experiencing chest pain, dyspnea, or significant activity limiting fatigue.  She has a fair amount of stress related to her husband's health and she partakes fully in his care.  Over the past 6 months, she has noted easy fatigability.  After they have been busy doing multiple chores around the house she sometimes feels as though she has taken a nap.  She was recently seen by her primary care provider who noted a baseline level of bradycardia (heart rate documented in the 50s on physical exam in clinic note).  Patient also reports that about a month ago or so she had thyroid studies which apparently reflected an elevated TSH.  Her Synthroid dose was increased to 88 mcg daily.   Overall, she reports feeling quite well.  Her fatigue only occurs after a fair amount of activity. No presyncope/syncope.  Home Medications  Prior to Admission medications  Medication Sig Start Date End Date Taking? Authorizing Provider  ALPRAZolam Prudy Feeler) 1 MG tablet Take 1.5 mg by mouth daily.     Yes Historical Provider, MD  aspirin EC 81 MG EC tablet Take 1 tablet (81 mg total) by mouth daily. 06/14/10  Yes Tonny Bollman, MD  Calcium Carbonate-Vitamin D (CALCIUM 600+D) 600-200 MG-UNIT TABS Take by mouth daily.     Yes Historical Provider, MD  cyclobenzaprine (FLEXERIL) 10 MG tablet Take 5 mg by mouth at bedtime.     Yes Historical Provider, MD  diclofenac sodium (VOLTAREN) 1 % GEL Apply topically 2 (two) times daily.     Yes Historical Provider, MD  DULoxetine (CYMBALTA) 30 MG capsule Take 30 mg by mouth daily.   Yes Historical Provider, MD  levothyroxine (SYNTHROID, LEVOTHROID) 88 MCG tablet Take 88 mcg by mouth daily.     Yes Historical Provider,  MD  metoprolol (TOPROL-XL) 50 MG 24 hr tablet Take 1 tablet (50 mg total) by mouth daily. 12/02/10  Yes Tonny Bollman, MD  nitroGLYCERIN (NITROSTAT) 0.4 MG SL tablet Place 1 tablet (0.4 mg total) under the tongue every 5 (five) minutes as needed for chest pain. 06/08/11  Yes Ok Anis, NP  omeprazole (PRILOSEC) 20 MG capsule Take 1 capsule (20 mg total) by mouth 2 (two) times daily. 05/01/11 04/30/12 Yes Len Blalock, NP  ramipril (ALTACE) 5 MG capsule Take 1 capsule (5 mg total) by mouth daily. 03/07/11  Yes Laurey Morale, MD   Review of Systems  Reports fatigue as outlined in the HPI along w/ occasional chest cramp.  No sob, n, v, dizziness, syncope, edema, early satiety, dysuria, dark stools, blood in stools, diarrhea, rash/skin changes, fevers, chills, wt loss/gain.  Otherwise all systems reviewed and negative.  Physical Exam  Blood pressure 120/60, pulse 57, height 5\' 7"  (1.702 m), weight 135 lb (61.236 kg).  General: Pleasant, NAD Psych: Normal affect. Neuro: Alert and oriented X 3. Moves all extremities spontaneously. HEENT: Normal  Neck: Supple without bruits or JVD. Lungs:  Resp regular and unlabored, CTA. Heart: RRR no s3, s4, or murmurs. Abdomen: Soft, non-tender, non-distended, BS + x 4.  Extremities: No clubbing, cyanosis or edema. DP/PT/Radials 1+ and equal bilaterally.  Accessory Clinical Findings  ECG - sinus bradycardia, 57, PACs with anterior T wave inversion which is unchanged compared with old ECG.  Assessment & Plan  1.  Fatigue:  Patient reports about a six-month history of easy fatigability.  Despite this complaint, she also says that overall she feels quite well and is able to remain active around her house and taking care of her husband.  We discussed diagnostic testing including echocardiogram and monitoring however patient felt that she is overall doing pretty well include like to defer any testing at this time.  She is mildly bradycardic with heart rate  57 today.  She is on beta blocker therapy and has been on this for a prolonged period of time.  Is not clear that this is contributing at all to her symptoms.  2.  Sinus bradycardia:  As above, not clear this contributing to symptoms.  Would not make any changes to her medications.  3.  Coronary artery disease:  Patient reports a long history of occasional chest cramp occurring 2 or 3 times per year and resolving spontaneously.  Last catheterization was in 2007 revealing patent grafts with an atretic LIMA to the LAD.  That was followed by a Myoview which showed no evidence of ischemia.  She is able to accomplish a fair amount of activity at home including vacuuming 7 rooms without rest.  This being the case, would not pursue ischemic testing at this time instead continue medical therapy.  We have refilled her sublingual nitroglycerin.  4.  Paroxysmal atrial fibrillation:  Patient maintaining sinus rhythm and has no recent history of palpitations.  Continue aspirin and beta blocker.  She is not on Coumadin secondary to history of falls.  5.  Depression:  Given her relatively high level of activity around her home I questioned what extent this may be extruding to her fatigue.  She spent a good portion of her visit today discussing her husband's health and how it impacts her.  6.  Disposition:  Follow up with Dr. [name] in 3 months or sooner if necessary.   Nicolasa Ducking, NP 06/08/2011, 4:39 PM

## 2011-06-08 NOTE — Patient Instructions (Signed)
Your physician recommends that you schedule a follow-up appointment in: 3 months with Dr. Cooper.  

## 2011-07-03 ENCOUNTER — Encounter: Payer: Self-pay | Admitting: Cardiovascular Disease

## 2011-08-09 ENCOUNTER — Ambulatory Visit (INDEPENDENT_AMBULATORY_CARE_PROVIDER_SITE_OTHER): Payer: Medicare Other | Admitting: Cardiovascular Disease

## 2011-08-09 ENCOUNTER — Encounter: Payer: Self-pay | Admitting: Cardiovascular Disease

## 2011-08-09 VITALS — BP 140/60 | HR 50 | Ht 67.0 in | Wt 136.8 lb

## 2011-08-09 DIAGNOSIS — I2510 Atherosclerotic heart disease of native coronary artery without angina pectoris: Secondary | ICD-10-CM

## 2011-08-09 MED ORDER — METOPROLOL SUCCINATE ER 25 MG PO TB24: 25.0000 mg | ORAL_TABLET | Freq: Every day | ORAL | Status: DC

## 2011-08-09 NOTE — Progress Notes (Signed)
HPI:  78 year old woman presenting for followup evaluation. The patient has coronary artery disease status post CABG. She has not had any ischemic events in many years. Her main complaint over the last few years has been related to generalized fatigue. She has a lot of stress related to her husband's health, but he continues to remain fairly stable by her report. He's even been getting out and doing a little bit of tinkering in the yard. The patient denies chest pain or pressure, dyspnea, edema, or palpitations. Her fatigue is only mild and she hasn't noted any change in the last several months. She has no specific complaints today.  Outpatient Encounter Prescriptions as of 08/09/2011  Medication Sig Dispense Refill  . ALPRAZolam (XANAX) 1 MG tablet Take 1.5 mg by mouth daily.        Marland Kitchen aspirin EC 81 MG EC tablet Take 1 tablet (81 mg total) by mouth daily.      . Calcium Carbonate-Vitamin D (CALCIUM 600+D) 600-200 MG-UNIT TABS Take by mouth daily.        . citalopram (CELEXA) 20 MG tablet Take 20 mg by mouth daily.      . cyclobenzaprine (FLEXERIL) 10 MG tablet Take 5 mg by mouth at bedtime.        . DULoxetine (CYMBALTA) 30 MG capsule Take 30 mg by mouth daily.      Marland Kitchen levothyroxine (SYNTHROID, LEVOTHROID) 88 MCG tablet Take 88 mcg by mouth daily.        . metoprolol succinate (TOPROL-XL) 25 MG 24 hr tablet Take 1 tablet (25 mg total) by mouth daily.  90 tablet  3  . nitroGLYCERIN (NITROSTAT) 0.4 MG SL tablet Place 1 tablet (0.4 mg total) under the tongue every 5 (five) minutes as needed for chest pain.  25 tablet  6  . NON FORMULARY Take 1,000 mcg by mouth daily. Hair, skin and nails vitamin      . omeprazole (PRILOSEC) 20 MG capsule Take 1 capsule (20 mg total) by mouth 2 (two) times daily.  60 capsule  1  . ramipril (ALTACE) 5 MG capsule Take 1 capsule (5 mg total) by mouth daily.  90 capsule  2  . DISCONTD: metoprolol (TOPROL-XL) 50 MG 24 hr tablet Take 1 tablet (50 mg total) by mouth daily.   90 tablet  3  . DISCONTD: diclofenac sodium (VOLTAREN) 1 % GEL Apply topically 2 (two) times daily.          Allergies  Allergen Reactions  . Codeine     REACTION: Nausea  . Tramadol     REACTION: sick stomach    Past Medical History  Diagnosis Date  . CAD (coronary artery disease)     a.  s/p MI;  b. s/p CABG x 4 - LIMA->LAD, VG->DIAG, VG->PLA, VG->PDA;  c. Cath 05/2011 - LM 50-60%,  severe LAD/RCA dzs,  LIMA atretic, VG's patent;  d. Myoview 05/2005 (done after cath) EF 72%, no ischemia, fixed ant thinning.  . Carotid artery disease   . Paroxysmal a-fib     a. no longer on coumadin 2/2 h/o falls.  Marland Kitchen HTN (hypertension)   . Hyperlipidemia   . Arthropathy, unspecified, other specified sites   . Sacroiliac joint dysfunction   . Spinal stenosis   . Anemia     iron deficiency  . Hypercalcemia   . Osteoarthrosis, unspecified whether generalized or localized, ankle and foot   . Tibialis tendinitis   . Benign paroxysmal positional vertigo   .  Prerenal azotemia     mild  . Anemia, macrocytic   . Raynaud's syndrome   . Impingement syndrome of shoulder region     unspecified area  . Osteoporosis, unspecified   . Shoulder pain, bilateral   . Chronic low back pain   . Arthritis   . Constipation   . PUD (peptic ulcer disease)   . Depression   . Anxiety   . Allergic rhinitis   . Fatigue     ROS: Negative except as per HPI  BP 140/60  Pulse 50  Ht 5\' 7"  (1.702 m)  Wt 62.052 kg (136 lb 12.8 oz)  BMI 21.43 kg/m2  PHYSICAL EXAM: Pt is alert and oriented, pleasant elderly woman in NAD HEENT: normal Neck: JVP - normal, carotids 2+= without bruits Lungs: CTA bilaterally CV: Bradycardic and regular without murmur or gallop Abd: soft, NT, Positive BS, no hepatomegaly Ext: no C/C/E, distal pulses intact and equal Skin: warm/dry no rash  ASSESSMENT AND PLAN: 1. CAD status post CABG. She has rare episodes of "chest cramping." These have always been self-limited and do not  require therapy or sublingual nitroglycerin. With no change in the pattern. I would not suggest any further testing at this time. The patient remains on low-dose aspirin for antiplatelet therapy.  2. Sinus bradycardia. Recommend reduce metoprolol succinate 25 mg daily  For followup, I would like to see the patient back in 6 months.  Tonny Bollman 08/09/2011 2:17 PM

## 2011-08-09 NOTE — Patient Instructions (Addendum)
Your physician wants you to follow-up in: 12 months. You will receive a reminder letter in the mail two months in advance. If you don't receive a letter, please call our office to schedule the follow-up appointment.  Your physician has recommended you make the following change in your medication: Decrease Toprol  XL to 25 mg by mouth daily

## 2011-08-14 ENCOUNTER — Encounter (HOSPITAL_COMMUNITY): Payer: Self-pay | Admitting: *Deleted

## 2011-08-14 ENCOUNTER — Emergency Department (HOSPITAL_COMMUNITY)
Admission: EM | Admit: 2011-08-14 | Discharge: 2011-08-14 | Disposition: A | Discharge status: Home / Self Care | Payer: Medicare Other | Attending: Emergency Medicine | Admitting: Emergency Medicine

## 2011-08-14 DIAGNOSIS — Z79899 Other long term (current) drug therapy: Secondary | ICD-10-CM | POA: Insufficient documentation

## 2011-08-14 DIAGNOSIS — E785 Hyperlipidemia, unspecified: Secondary | ICD-10-CM | POA: Insufficient documentation

## 2011-08-14 DIAGNOSIS — I10 Essential (primary) hypertension: Secondary | ICD-10-CM | POA: Insufficient documentation

## 2011-08-14 DIAGNOSIS — S51809A Unspecified open wound of unspecified forearm, initial encounter: Secondary | ICD-10-CM | POA: Insufficient documentation

## 2011-08-14 DIAGNOSIS — W010XXA Fall on same level from slipping, tripping and stumbling without subsequent striking against object, initial encounter: Secondary | ICD-10-CM | POA: Insufficient documentation

## 2011-08-14 DIAGNOSIS — Y92009 Unspecified place in unspecified non-institutional (private) residence as the place of occurrence of the external cause: Secondary | ICD-10-CM | POA: Insufficient documentation

## 2011-08-14 DIAGNOSIS — S91009A Unspecified open wound, unspecified ankle, initial encounter: Secondary | ICD-10-CM | POA: Insufficient documentation

## 2011-08-14 DIAGNOSIS — T148XXA Other injury of unspecified body region, initial encounter: Secondary | ICD-10-CM

## 2011-08-14 DIAGNOSIS — S81009A Unspecified open wound, unspecified knee, initial encounter: Secondary | ICD-10-CM | POA: Insufficient documentation

## 2011-08-14 NOTE — ED Notes (Signed)
Pt fell in yard while trimming bushes. Lac to right lower leg and skin tears to right arm. NAD.

## 2011-08-14 NOTE — Discharge Instructions (Signed)
Laceration Care, Adult A laceration is a cut that goes through all layers of the skin. The cut goes into the tissue beneath the skin. HOME CARE For stitches (sutures) or staples:  Keep the cut clean and dry.   If you have a bandage (dressing), change it at least once a day. Change the bandage if it gets wet or dirty, or as told by your doctor.   Wash the cut with soap and water 2 times a day. Rinse the cut with water. Pat it dry with a clean towel.   Put a thin layer of medicated cream on the cut as told by your doctor.   You may shower after the first 24 hours. Do not soak the cut in water until the stitches are removed.   Only take medicines as told by your doctor.   Have your stitches or staples removed as told by your doctor.  For skin adhesive strips:  Keep the cut clean and dry.   Do not get the strips wet. You may take a bath, but be careful to keep the cut dry.   If the cut gets wet, pat it dry with a clean towel.   The strips will fall off on their own. Do not remove the strips that are still stuck to the cut.  For wound glue:  You may shower or take baths. Do not soak or scrub the cut. Do not swim. Avoid heavy sweating until the glue falls off on its own. After a shower or bath, pat the cut dry with a clean towel.   Do not put medicine on your cut until the glue falls off.   If you have a bandage, do not put tape over the glue.   Avoid lots of sunlight or tanning lamps until the glue falls off. Put sunscreen on the cut for the first year to reduce your scar.   The glue will fall off on its own. Do not pick at the glue.  You may need a tetanus shot if:  You cannot remember when you had your last tetanus shot.   You have never had a tetanus shot.  If you need a tetanus shot and you choose not to have one, you may get tetanus. Sickness from tetanus can be serious. GET HELP RIGHT AWAY IF:   Your pain does not get better with medicine.   Your arm, hand, leg, or  foot loses feeling (numbness) or changes color.   Your cut is bleeding.   Your joint feels weak, or you cannot use your joint.   You have painful lumps on your body.   Your cut is red, puffy (swollen), or painful.   You have a red line on the skin near the cut.   You have yellowish-white fluid (pus) coming from the cut.   You have a fever.   You have a bad smell coming from the cut or bandage.   Your cut breaks open before or after stitches are removed.   You notice something coming out of the cut, such as wood or glass.   You cannot move a finger or toe.  MAKE SURE YOU:   Understand these instructions.   Will watch your condition.   Will get help right away if you are not doing well or get worse.  Document Released: 08/02/2007 Document Revised: 02/02/2011 Document Reviewed: 08/09/2010 [facility] Patient Information 2012 [place], [place].  Wound care as directed. Return for new worse symptoms.

## 2011-08-14 NOTE — ED Provider Notes (Signed)
History  This chart was scribed for Shelda Jakes, MD by Bennett Scrape. This patient was seen in room [facility]14[facility]14 and the patient's care was started at 1:09PM.  CSN: [id number]  Arrival date & time 08/14/11  1214   First MD Initiated Contact with Patient 08/14/11 1309      Chief Complaint  Patient presents with  . Extremity Laceration    Patient is a 78 y.o. female presenting with skin laceration. The history is provided by the patient. No language interpreter was used.  Laceration  The incident occurred less than 1 hour ago. The laceration is located on the right leg and right arm. Size: Between 1 cm to 7 cm. Injury mechanism: tree stump. The patient is experiencing no pain. Foreign body present: dirt. Her tetanus status is UTD.    [name] is a 78 y.o. female who presents to the Emergency Department complaining of sudden onset, non-changing, constant extremity lacerations described as skin tears on her right arm and right shin after falling in her yard while trimming bushes. Pt states that she was bent over trimming when she stumbled over a root in the ground approximately 30 minutes PTA. The bleeding is controlled at this time and pt states that she attempted to clean the tears before coming to this ED. She denies having pain. She denies head trauma or LOC. She denies nausea, chest pain, SOB, HA, and rash as associated symptoms. She reports that her TD is UTD stating her last TD vaccine was 2 years ago. She has a h/o CAD, HTN, HLD, OA, and chronic back pain. Pt denies smoking and alcohol use.  Dr. Dwana Melena is PCP.  Past Medical History  Diagnosis Date  . CAD (coronary artery disease)     a.  s/p MI;  b. s/p CABG x 4 - LIMA->LAD, VG->DIAG, VG->PLA, VG->PDA;  c. Cath 05/2011 - LM 50-60%,  severe LAD/RCA dzs,  LIMA atretic, VG's patent;  d. Myoview 05/2005 (done after cath) EF 72%, no ischemia, fixed ant thinning.  . Carotid artery disease   . Paroxysmal a-fib     a. no  longer on coumadin 2/2 h/o falls.  Marland Kitchen HTN (hypertension)   . Hyperlipidemia   . Arthropathy, unspecified, other specified sites   . Sacroiliac joint dysfunction   . Spinal stenosis   . Anemia     iron deficiency  . Hypercalcemia   . Osteoarthrosis, unspecified whether generalized or localized, ankle and foot   . Tibialis tendinitis   . Benign paroxysmal positional vertigo   . Prerenal azotemia     mild  . Anemia, macrocytic   . Raynaud's syndrome   . Impingement syndrome of shoulder region     unspecified area  . Osteoporosis, unspecified   . Shoulder pain, bilateral   . Chronic low back pain   . Arthritis   . Constipation   . PUD (peptic ulcer disease)   . Depression   . Anxiety   . Allergic rhinitis   . Fatigue     Past Surgical History  Procedure Date  . Appendectomy   . Cholecystectomy   . Laparotomy     exploratory  . Coronary artery bypass graft     4 grafts - 1998  . Hiatal hernia repair   . Roux-n-y   . Cataract extraction     No family history on file.  History  Substance Use Topics  . Smoking status: Never Smoker   . Smokeless tobacco: Not on  file  . Alcohol Use: No     Review of Systems  Constitutional: Negative for fever.  HENT: Negative for congestion and sore throat.   Eyes: Negative for visual disturbance.  Respiratory: Negative for cough and shortness of breath.   Cardiovascular: Negative for chest pain.  Gastrointestinal: Negative for vomiting, abdominal pain and diarrhea.  Genitourinary: Negative for dysuria.  Musculoskeletal: Negative for back pain.  Skin: Positive for wound (Skin tears ). Negative for rash.  Neurological: Negative for syncope and headaches.  Hematological: Does not bruise/bleed easily.  Psychiatric/Behavioral: Negative for confusion.    Allergies  Codeine and Tramadol  Home Medications   Current Outpatient Rx  Name Route Sig Dispense Refill  . ALPRAZOLAM 1 MG PO TABS Oral Take 1.5 mg by mouth daily.     .  ASPIRIN EC 81 MG PO TBEC Oral Take 1 tablet (81 mg total) by mouth daily.    Marland Kitchen CALCIUM CARBONATE-VITAMIN D 600-200 MG-UNIT PO TABS Oral Take by mouth daily.      Marland Kitchen CITALOPRAM HYDROBROMIDE 20 MG PO TABS Oral Take 20 mg by mouth daily.    . CYCLOBENZAPRINE HCL 10 MG PO TABS Oral Take 10 mg by mouth at bedtime.     . DULOXETINE HCL 30 MG PO CPEP Oral Take 30 mg by mouth daily.    Marland Kitchen LEVOTHYROXINE SODIUM 88 MCG PO TABS Oral Take 88 mcg by mouth daily.      Marland Kitchen METOPROLOL SUCCINATE ER 25 MG PO TB24 Oral Take 1 tablet (25 mg total) by mouth daily. 90 tablet 3  . MOMETASONE FUROATE 50 MCG/ACT NA SUSP Nasal Place 2 sprays into the nose daily.    Marland Kitchen NITROGLYCERIN 0.4 MG SL SUBL Sublingual Place 1 tablet (0.4 mg total) under the tongue every 5 (five) minutes as needed for chest pain. 25 tablet 6  . NON FORMULARY Oral Take 1,000 mcg by mouth daily. Hair, skin and nails vitamin    . OMEPRAZOLE 20 MG PO CPDR Oral Take 1 capsule (20 mg total) by mouth 2 (two) times daily. 60 capsule 1  . RAMIPRIL 5 MG PO CAPS Oral Take 1 capsule (5 mg total) by mouth daily. 90 capsule 2    Triage Vitals: BP 136/40  Pulse 54  Temp 98.3 F (36.8 C) (Oral)  Resp 18  SpO2 97%  Physical Exam  Nursing note and vitals reviewed. Constitutional: She is oriented to person, place, and time. She appears well-developed and well-nourished. No distress.  HENT:  Head: Normocephalic and atraumatic.  Eyes: Conjunctivae and EOM are normal.  Neck: Neck supple. No tracheal deviation present.  Cardiovascular: Normal rate and regular rhythm.   No murmur heard. Pulmonary/Chest: Effort normal and breath sounds normal. No respiratory distress.  Abdominal: Soft. Bowel sounds are normal.  Musculoskeletal: Normal range of motion.  Neurological: She is alert and oriented to person, place, and time. No cranial nerve deficit.  Skin: Skin is warm and dry.       6 cm avulsion flap on right anterior shin, right arm has two 1 cm skin tears, 7 cm  avulsion flap and a 2 cm elliptical flap on right forearm   Psychiatric: She has a normal mood and affect. Her behavior is normal.    ED Course  Procedures (including critical care time)  DIAGNOSTIC STUDIES: Oxygen Saturation is 97% on room air, adequate by my interpretation.    COORDINATION OF CARE: 1:40PM-Discussed discharge plan of wound care at home with pt and pt agreed  to plan. I advised against sutures/staples due to the locations of the wounds.   Labs Reviewed - No data to display No results found.   1. Multiple skin tears       MDM  Multiple skin tears as noted above suturing and stapling not beneficial Steri-Strips placed nonadherent dressings applied wounds were irrigated. Wound care instructions provided to the patient.  I personally performed the services described in this documentation, which was scribed in my presence. The recorded information has been reviewed and considered.       Shelda Jakes, MD 08/14/11 [phone]

## 2011-09-08 ENCOUNTER — Ambulatory Visit: Payer: Medicare Other | Admitting: Cardiovascular Disease

## 2011-10-04 ENCOUNTER — Other Ambulatory Visit: Payer: Self-pay | Admitting: Cardiovascular Disease

## 2011-10-12 ENCOUNTER — Telehealth: Payer: Self-pay | Admitting: Cardiovascular Disease

## 2011-10-12 NOTE — Telephone Encounter (Signed)
Pt calling wanting to know which tablet she should cut in 1/2--LM on identified voice mail " cut your metoprolol 50mg  tablet in 1/2, so you are taking 25mg  q hs--advised to call back if any questions

## 2011-10-12 NOTE — Telephone Encounter (Signed)
New Problem:    Patient called in wanting to know if she is supposed to half her metoprolol succinate (TOPROL-XL) 25 MG 24 hr tablet or her levothyroxine (SYNTHROID, LEVOTHROID) 88 MCG tablet.  Please call back.

## 2011-10-12 NOTE — Telephone Encounter (Signed)
Patient is aware to take !/2 tablet of 50 mg Metoprolol Succinate. Patient verbalized understanding.

## 2011-10-12 NOTE — Telephone Encounter (Signed)
Pt wanted to confirm if she is to take 50 mg or 25mg  of her metoprolol she thinks she cut off half the message you left. She will be home til 4pm

## 2011-12-06 ENCOUNTER — Other Ambulatory Visit (HOSPITAL_COMMUNITY): Payer: Self-pay | Admitting: Internal Medicine

## 2011-12-06 DIAGNOSIS — Z139 Encounter for screening, unspecified: Secondary | ICD-10-CM

## 2011-12-29 ENCOUNTER — Other Ambulatory Visit: Payer: Self-pay | Admitting: Cardiology

## 2012-01-01 ENCOUNTER — Other Ambulatory Visit: Payer: Self-pay | Admitting: *Deleted

## 2012-01-01 ENCOUNTER — Other Ambulatory Visit: Payer: Self-pay | Admitting: Cardiology

## 2012-01-01 MED ORDER — RAMIPRIL 5 MG PO CAPS: 5.0000 mg | ORAL_CAPSULE | Freq: Every day | ORAL | Status: DC

## 2012-01-04 ENCOUNTER — Ambulatory Visit (HOSPITAL_COMMUNITY)
Admission: RE | Admit: 2012-01-04 | Discharge: 2012-01-04 | Disposition: A | Discharge status: Home / Self Care | Payer: Medicare Other | Source: Ambulatory Visit | Attending: Internal Medicine | Admitting: Internal Medicine

## 2012-01-04 DIAGNOSIS — Z1231 Encounter for screening mammogram for malignant neoplasm of breast: Secondary | ICD-10-CM | POA: Insufficient documentation

## 2012-01-04 DIAGNOSIS — Z139 Encounter for screening, unspecified: Secondary | ICD-10-CM

## 2012-02-19 ENCOUNTER — Ambulatory Visit (HOSPITAL_COMMUNITY)
Admission: RE | Admit: 2012-02-19 | Discharge: 2012-02-19 | Disposition: A | Discharge status: Home / Self Care | Payer: Medicare Other | Source: Ambulatory Visit | Attending: Internal Medicine | Admitting: Internal Medicine

## 2012-02-19 ENCOUNTER — Other Ambulatory Visit (HOSPITAL_COMMUNITY): Payer: Self-pay | Admitting: Internal Medicine

## 2012-02-19 DIAGNOSIS — R05 Cough: Secondary | ICD-10-CM

## 2012-02-19 DIAGNOSIS — Z8611 Personal history of tuberculosis: Secondary | ICD-10-CM

## 2012-02-19 DIAGNOSIS — R059 Cough, unspecified: Secondary | ICD-10-CM | POA: Insufficient documentation

## 2012-03-04 ENCOUNTER — Telehealth: Payer: Self-pay | Admitting: Cardiovascular Disease

## 2012-03-04 NOTE — Telephone Encounter (Signed)
Pt had chest pain this morning and she has taken 3 nitro to get relief.

## 2012-03-04 NOTE — Telephone Encounter (Signed)
Pt states that she has been having chest pain since last pm.  She has taken 3 SL Nitroglycerins this a.m. With minimal relief.  States some SOB.  Advised pt to go to the ED at [facility].  Pt is refusing to go to the ED stating that she only wants to see Dr. [name], not a PA or NP.  Advised pt that Dr. [name] will not be in the office seeing patients until Wednesday, 03/06/2012.  Pt states that she wants to leave a message for him to call her back.  I again stresses to her that she should go to the ED when she has chest pain that is not relieved after 3 nitroglycerins.  She verbalized understanding of my instructions but still refuses to go.  Will forward message to Dr. [name] and also page him to alert him of message.

## 2012-03-06 ENCOUNTER — Ambulatory Visit (INDEPENDENT_AMBULATORY_CARE_PROVIDER_SITE_OTHER): Payer: Medicare Other | Admitting: Cardiovascular Disease

## 2012-03-06 ENCOUNTER — Encounter: Payer: Self-pay | Admitting: Cardiovascular Disease

## 2012-03-06 ENCOUNTER — Telehealth: Payer: Self-pay | Admitting: Cardiovascular Disease

## 2012-03-06 VITALS — BP 151/68 | HR 67 | Ht 67.0 in | Wt 135.0 lb

## 2012-03-06 DIAGNOSIS — I209 Angina pectoris, unspecified: Secondary | ICD-10-CM

## 2012-03-06 NOTE — Patient Instructions (Addendum)
Your physician wants you to follow-up in: 6 months.   You will receive a reminder letter in the mail two months in advance. If you don't receive a letter, please call our office to schedule the follow-up appointment.  Your physician has requested that you have a lexiscan myoview. For further information please visit [email or web address]. Please follow instruction sheet, as given.   

## 2012-03-06 NOTE — Telephone Encounter (Signed)
New problem:    Patient was seen today was told Rx would be called in [facility]  [phone]. Patient is asking for this to be delivered.

## 2012-03-06 NOTE — Telephone Encounter (Signed)
**Note De-Identified Haeli Gerlich Obfuscation** Dr. [name] states that he wants to wait until after pt's stress test to start new medication, pt advised and she verbalized understanding.

## 2012-03-06 NOTE — Progress Notes (Signed)
HPI:  [name] presents for cardiology followup. She is an 79 year old woman with CAD status post CABG.  She called in Monday with progressive chest pain and the triage nurse recommended that she go to the emergency department. The patient declined these instructions and presents today for followup. She reports an episode of chest "cramping" early Monday morning that wasn't relieved until she took her third nitroglycerin. These episodes occur in the early morning hours and they awaken her from sleep. She has had this intermittently for about 6 months. She does not have chest discomfort during the day. The symptoms have been responsive to nitroglycerin in the past. She denies associated shortness of breath, palpitations, edema, orthopnea, or PND. Over the last 6 months she has been taking nitroglycerin more frequently than in the past.  Outpatient Encounter Prescriptions as of 03/06/2012  Medication Sig Dispense Refill  . ALPRAZolam (XANAX) 1 MG tablet Take 1.5 mg by mouth daily.       Marland Kitchen aspirin EC 81 MG EC tablet Take 1 tablet (81 mg total) by mouth daily.      . Calcium Carbonate-Vitamin D (CALCIUM 600+D) 600-200 MG-UNIT TABS Take by mouth daily.        . citalopram (CELEXA) 20 MG tablet Take 20 mg by mouth daily.      . cyclobenzaprine (FLEXERIL) 10 MG tablet Take 10 mg by mouth at bedtime.       . DULoxetine (CYMBALTA) 30 MG capsule Take 30 mg by mouth daily.      Marland Kitchen levothyroxine (SYNTHROID, LEVOTHROID) 88 MCG tablet Take 88 mcg by mouth daily.        . metoprolol succinate (TOPROL-XL) 25 MG 24 hr tablet Take 1 tablet (25 mg total) by mouth daily.  90 tablet  3  . mometasone (NASONEX) 50 MCG/ACT nasal spray Place 2 sprays into the nose daily.      . nitroGLYCERIN (NITROSTAT) 0.4 MG SL tablet Place 1 tablet (0.4 mg total) under the tongue every 5 (five) minutes as needed for chest pain.  25 tablet  6  . omeprazole (PRILOSEC) 20 MG capsule Take 1 capsule (20 mg total) by mouth 2 (two) times  daily.  60 capsule  1  . ramipril (ALTACE) 5 MG capsule Take 1 capsule (5 mg total) by mouth daily.  90 capsule  1  . TOPROL XL 50 MG 24 hr tablet TAKE (1) TABLET BY MOUTH AT BEDTIME.  90 each  3  . NON FORMULARY Take 1,000 mcg by mouth daily. Hair, skin and nails vitamin        Allergies  Allergen Reactions  . Codeine Nausea And Vomiting  . Tramadol Nausea And Vomiting    Past Medical History  Diagnosis Date  . CAD (coronary artery disease)     a.  s/p MI;  b. s/p CABG x 4 - LIMA->LAD, VG->DIAG, VG->PLA, VG->PDA;  c. Cath 05/2011 - LM 50-60%,  severe LAD/RCA dzs,  LIMA atretic, VG's patent;  d. Myoview 05/2005 (done after cath) EF 72%, no ischemia, fixed ant thinning.  . Carotid artery disease   . Paroxysmal a-fib     a. no longer on coumadin 2/2 h/o falls.  Marland Kitchen HTN (hypertension)   . Hyperlipidemia   . Arthropathy, unspecified, other specified sites   . Sacroiliac joint dysfunction   . Spinal stenosis   . Anemia     iron deficiency  . Hypercalcemia   . Osteoarthrosis, unspecified whether generalized or localized, ankle and foot   .  Tibialis tendinitis   . Benign paroxysmal positional vertigo   . Prerenal azotemia     mild  . Anemia, macrocytic   . Raynaud's syndrome   . Impingement syndrome of shoulder region     unspecified area  . Osteoporosis, unspecified   . Shoulder pain, bilateral   . Chronic low back pain   . Arthritis   . Constipation   . PUD (peptic ulcer disease)   . Depression   . Anxiety   . Allergic rhinitis   . Fatigue     ROS: Negative except as per HPI  BP 151/68  Pulse 67  Ht 5\' 7"  (1.702 m)  Wt 61.236 kg (135 lb)  BMI 21.14 kg/m2  SpO2 99%  PHYSICAL EXAM: Pt is alert and oriented, pleasant elderly woman in NAD HEENT: normal Neck: JVP - normal, carotids 2+= without bruits Lungs: CTA bilaterally CV: RRR with episodic premature beats. There is no significant murmur or gallop Abd: soft, NT, Positive BS, no hepatomegaly Ext: no C/C/E, distal  pulses intact and equal Skin: warm/dry no rash  EKG:  Sinus rhythm 67 beats per minute, marked sinus arrhythmia, nonspecific ST abnormality.  ASSESSMENT AND PLAN: 1. CAD status post CABG, now with episodic nocturnal angina. The patient's symptoms are progressive, although there some typical and atypical features. She's had some esophageal problems as well. I think at a minimum she requires a Lexiscan Myoview study to rule out significant ischemia considering her history of known coronary artery disease now several years out from bypass surgery. She will otherwise continue on her current medical program. She understands to call 911 if she experiences chest pain unrelieved with nitroglycerin. Will await results of her stress test, but consider addition of isosorbide 15 mg at bedtime to her medical regimen.  2. Hypertension. Continue metoprolol succinate and ramipril.  For followup, I will plan on seeing her back in 6 months unless her nuclear scan shows significant ischemia.  Tonny Bollman 03/06/2012 3:57 PM  ADDENDUM (1/16): Myoview scan negative for ischemia, normal LV function. Suspect noncardiac chest pain.  Tonny Bollman 03/14/2012 5:55 AM

## 2012-03-11 ENCOUNTER — Encounter: Payer: Self-pay | Admitting: Cardiovascular Disease

## 2012-03-12 ENCOUNTER — Ambulatory Visit (HOSPITAL_COMMUNITY): Payer: Medicare PPO | Attending: Cardiology | Admitting: Radiology

## 2012-03-12 VITALS — BP 131/58 | Ht 67.0 in | Wt 132.0 lb

## 2012-03-12 DIAGNOSIS — I10 Essential (primary) hypertension: Secondary | ICD-10-CM | POA: Insufficient documentation

## 2012-03-12 DIAGNOSIS — I209 Angina pectoris, unspecified: Secondary | ICD-10-CM

## 2012-03-12 DIAGNOSIS — I2510 Atherosclerotic heart disease of native coronary artery without angina pectoris: Secondary | ICD-10-CM

## 2012-03-12 DIAGNOSIS — R079 Chest pain, unspecified: Secondary | ICD-10-CM | POA: Insufficient documentation

## 2012-03-12 DIAGNOSIS — I4891 Unspecified atrial fibrillation: Secondary | ICD-10-CM | POA: Insufficient documentation

## 2012-03-12 MED ORDER — REGADENOSON 0.4 MG/5ML IV SOLN
0.4000 mg | Freq: Once | INTRAVENOUS | Status: AC
  Administered 2012-03-12: 0.4 mg via INTRAVENOUS

## 2012-03-12 MED ORDER — TECHNETIUM TC 99M SESTAMIBI GENERIC - CARDIOLITE
11.0000 | Freq: Once | INTRAVENOUS | Status: AC | PRN
  Administered 2012-03-12: 11 via INTRAVENOUS

## 2012-03-12 MED ORDER — TECHNETIUM TC 99M SESTAMIBI GENERIC - CARDIOLITE
33.0000 | Freq: Once | INTRAVENOUS | Status: AC | PRN
  Administered 2012-03-12: 33 via INTRAVENOUS

## 2012-03-12 NOTE — Progress Notes (Signed)
[facility] SITE 3 NUCLEAR MED [address] [place], [place] [address] [phone]    Cardiology Nuclear Med Study  [name] is a 79 y.o. female     MRN : [id number]     DOB: 02-Mar-1923  Procedure Date: 03/12/2012  Nuclear Med Background Indication for Stress Test:  Evaluation for Ischemia and Graft Patency History:  Afib;98' CABG x4;08' Echo EF=50-55% svere TR mild MR;07' Heart Catheterization  patent grafts ;Myocardial Infarction;10''Myocardail Perfusion Study EF63%NL  Cardiac Risk Factors: Carotid Disease, Hypertension and Lipids  Symptoms:  Chest Pain and Fatigue   Nuclear Pre-Procedure Caffeine/Decaff Intake:  None > 12 hrs NPO After: 5:00pm   Lungs:  clear O2 Sat: 100% on room air. IV 0.9% NS with Angio Cath:  24g  IV Site: R Forearm x 1, tolerated well IV Started by:  Irean Hong, RN  Chest Size (in):  38 Cup Size: B  Height: 5\' 7"  (1.702 m)  Weight:  132 lb (59.875 kg)  BMI:  Body mass index is 20.67 kg/(m^2). Tech Comments:  Took Toprol this am    Nuclear Med Study 1 or 2 day study: 1 day  Stress Test Type:  Lexiscan  Reading MD: Olga Millers, MD  Order Authorizing Provider:  Tonny Bollman, MD  Resting Radionuclide: Technetium 41m Sestamibi  Resting Radionuclide Dose: 11.0 mCi   Stress Radionuclide:  Technetium 6m Sestamibi  Stress Radionuclide Dose: 33.0 mCi           Stress Protocol Rest HR: 57 Stress HR: 80  Rest BP: 131/58 Stress BP: 149/60  Exercise Time (min): n/a METS: n/a   Predicted Max HR: 134 bpm % Max HR: 59.7 bpm Rate Pressure Product: 21308    Dose of Adenosine (mg):  n/a Dose of Lexiscan: 0.4 mg  Dose of Atropine (mg): n/a Dose of Dobutamine: n/a mcg/kg/min (at max HR)  Stress Test Technologist: Frederick Peers, EMT-P  Nuclear Technologist:  Domenic Polite, CNMT     Rest Procedure:  Myocardial perfusion imaging was performed at rest 45 minutes following the intravenous administration of Technetium 56m  Sestamibi. Rest ECG: Sinus bradycardia with pacs, poor R wave progression, nonspecific ST changes.  Stress Procedure:  The patient received IV Lexiscan 0.4 mg over 15-seconds.  Technetium 32m Sestamibi injected at 30-seconds.  Quantitative spect images were obtained after a 45 minute delay. Stress ECG: No significant ST segment change suggestive of ischemia.  QPS Raw Data Images:  Acquisition technically good; normal left ventricular size. Stress Images:  Normal homogeneous uptake in all areas of the myocardium. Rest Images:  Normal homogeneous uptake in all areas of the myocardium. Subtraction (SDS):  No evidence of ischemia. Transient Ischemic Dilatation (Normal <1.22):  1.05 Lung/Heart Ratio (Normal <0.45):  0.19  Quantitative Gated Spect Images QGS EDV:  75 ml QGS ESV:  27 ml  Impression Exercise Capacity:  Lexiscan with no exercise. BP Response:  Normal blood pressure response. Clinical Symptoms:  No chest pain or dyspnea. ECG Impression:  No significant ST segment change suggestive of ischemia. Comparison with Prior Nuclear Study: No images to compare  Overall Impression:  Normal stress nuclear study.  LV Ejection Fraction: 64%.  LV Wall Motion:  NL LV Function; NL Wall Motion   Olga Millers

## 2012-03-14 ENCOUNTER — Telehealth: Payer: Self-pay | Admitting: Cardiovascular Disease

## 2012-03-14 ENCOUNTER — Encounter: Payer: Self-pay | Admitting: Cardiovascular Disease

## 2012-03-14 NOTE — Telephone Encounter (Signed)
Pt called with normal stress test.

## 2012-03-14 NOTE — Telephone Encounter (Signed)
New problem:  Test results.  

## 2012-07-01 ENCOUNTER — Other Ambulatory Visit: Payer: Self-pay | Admitting: Cardiology

## 2012-07-17 ENCOUNTER — Encounter (INDEPENDENT_AMBULATORY_CARE_PROVIDER_SITE_OTHER): Payer: 59

## 2012-07-17 DIAGNOSIS — I6529 Occlusion and stenosis of unspecified carotid artery: Secondary | ICD-10-CM

## 2012-08-22 ENCOUNTER — Ambulatory Visit (INDEPENDENT_AMBULATORY_CARE_PROVIDER_SITE_OTHER): Payer: 59 | Admitting: Otolaryngology

## 2012-08-22 DIAGNOSIS — J343 Hypertrophy of nasal turbinates: Secondary | ICD-10-CM

## 2012-08-22 DIAGNOSIS — H903 Sensorineural hearing loss, bilateral: Secondary | ICD-10-CM

## 2012-08-22 DIAGNOSIS — J342 Deviated nasal septum: Secondary | ICD-10-CM

## 2012-11-28 ENCOUNTER — Telehealth: Payer: Self-pay | Admitting: Orthopedic Surgery

## 2012-11-28 NOTE — Telephone Encounter (Signed)
We dont do those here  See dr Channing Mutters

## 2012-11-28 NOTE — Telephone Encounter (Signed)
Call received from patient requesting injection in her low back, "the same way Dr. Romeo Apple has injected my knee" - last seen here for knee in 2012.  Patient's last notes indicate that she sees Dr Trey Sailors, and that she has had Epidural steroid injections.  Please advise regarding scheduling here per patient's request.  Her phone # is [phone].

## 2012-11-28 NOTE — Telephone Encounter (Signed)
Called back to patient. Relayed. °

## 2012-12-02 ENCOUNTER — Other Ambulatory Visit (HOSPITAL_COMMUNITY): Payer: Self-pay | Admitting: Internal Medicine

## 2012-12-02 DIAGNOSIS — Z139 Encounter for screening, unspecified: Secondary | ICD-10-CM

## 2012-12-28 ENCOUNTER — Other Ambulatory Visit: Payer: Self-pay | Admitting: Cardiovascular Disease

## 2013-01-06 ENCOUNTER — Ambulatory Visit (HOSPITAL_COMMUNITY)
Admission: RE | Admit: 2013-01-06 | Discharge: 2013-01-06 | Disposition: A | Discharge status: Home / Self Care | Payer: Medicare PPO | Source: Ambulatory Visit | Attending: Internal Medicine | Admitting: Internal Medicine

## 2013-01-06 DIAGNOSIS — Z1231 Encounter for screening mammogram for malignant neoplasm of breast: Secondary | ICD-10-CM | POA: Insufficient documentation

## 2013-01-06 DIAGNOSIS — Z139 Encounter for screening, unspecified: Secondary | ICD-10-CM

## 2013-02-28 ENCOUNTER — Other Ambulatory Visit: Payer: Self-pay | Admitting: Cardiovascular Disease

## 2013-03-04 ENCOUNTER — Other Ambulatory Visit: Payer: Self-pay | Admitting: Cardiovascular Disease

## 2013-03-06 ENCOUNTER — Encounter (INDEPENDENT_AMBULATORY_CARE_PROVIDER_SITE_OTHER): Payer: Self-pay | Admitting: *Deleted

## 2013-03-18 ENCOUNTER — Encounter (INDEPENDENT_AMBULATORY_CARE_PROVIDER_SITE_OTHER): Payer: Self-pay | Admitting: *Deleted

## 2013-03-18 ENCOUNTER — Encounter (INDEPENDENT_AMBULATORY_CARE_PROVIDER_SITE_OTHER): Payer: Self-pay | Admitting: Internal Medicine

## 2013-03-18 ENCOUNTER — Other Ambulatory Visit (INDEPENDENT_AMBULATORY_CARE_PROVIDER_SITE_OTHER): Payer: Self-pay | Admitting: *Deleted

## 2013-03-18 ENCOUNTER — Ambulatory Visit (INDEPENDENT_AMBULATORY_CARE_PROVIDER_SITE_OTHER): Payer: Medicare PPO | Admitting: Internal Medicine

## 2013-03-18 VITALS — BP 128/40 | HR 60 | Temp 97.2°F | Ht 67.0 in | Wt 134.3 lb

## 2013-03-18 DIAGNOSIS — R110 Nausea: Secondary | ICD-10-CM

## 2013-03-18 NOTE — Patient Instructions (Signed)
EGD with Dr. Rehman. 

## 2013-03-18 NOTE — Progress Notes (Signed)
Subjective:     Patient ID: [name], female   DOB: 08-18-1923, 80 y.o.   MRN: [id number]  HPI Referred to our office by Dr. Dwana MelenaZack Hall for anemia. She has had drive heaves a few weeks ago for 2 days. There was really no vomiting. She says she will heave for no reason. She says her stomach sound loud after eating.  The nausea is 2-3 times a week and sometimes it may be more.  She will heave sometimes before she eats and sometimes after she eats. Heaving is not related to food. She really does not have any acid reflux.  Her appetite is great.  She has a lot of gas. She usually has a BM daily. She occasionally see blood when she wipes. She wants Dr. Karilyn Cotaehman to do an EGD.   No weight loss. She has gained 1 pound since 2009.   02/14/2013  H and H 10.1 and 31.4, MCV 90.0, platelet ct 290.0 TSH 1.286., Vitamin B 12 681.   EGD 2009: Hx of GERD and hx of fundal wrap several years ago: Mild changes of reflux esophagitis limited to GE junction. Loose fundal wrap. No evidence of PUD or pyloric stenosis.   Hx Raynauds.  Review of Systems Current Outpatient Prescriptions  Medication Sig Dispense Refill  . ALPRAZolam (XANAX) 1 MG tablet Take 1.5 mg by mouth daily.       Marland Kitchen. aspirin EC 81 MG EC tablet Take 1 tablet (81 mg total) by mouth daily.      . Biotin 1000 MCG tablet Take 1,000 mcg by mouth 3 (three) times daily.      . citalopram (CELEXA) 20 MG tablet Take 20 mg by mouth daily.      . cyclobenzaprine (FLEXERIL) 10 MG tablet Take 10 mg by mouth at bedtime.       Marland Kitchen. levothyroxine (SYNTHROID, LEVOTHROID) 88 MCG tablet Take 88 mcg by mouth daily.        . metoprolol succinate (TOPROL-XL) 25 MG 24 hr tablet Take 1 tablet (25 mg total) by mouth daily.  90 tablet  3  . nitroGLYCERIN (NITROSTAT) 0.4 MG SL tablet Place 1 tablet (0.4 mg total) under the tongue every 5 (five) minutes as needed for chest pain.  25 tablet  6  . NON FORMULARY Take 1,000 mcg by mouth daily. Hair, skin and nails  vitamin      . ramipril (ALTACE) 5 MG capsule TAKE ONE CAPSULE DAILY.  90 capsule  0  . senna (SENOKOT) 8.6 MG tablet Take 1 tablet by mouth daily.      . DULoxetine (CYMBALTA) 30 MG capsule Take 30 mg by mouth daily.      Marland Kitchen. ipratropium (ATROVENT) 0.06 % nasal spray Place 2 sprays into both nostrils 4 (four) times daily.      Marland Kitchen. omeprazole (PRILOSEC) 20 MG capsule Take 1 capsule (20 mg total) by mouth 2 (two) times daily.  60 capsule  1   No current facility-administered medications for this visit.   Past Medical History  Diagnosis Date  . CAD (coronary artery disease)     a.  s/p MI;  b. s/p CABG x 4 - LIMA->LAD, VG->DIAG, VG->PLA, VG->PDA;  c. Cath 05/2011 - LM 50-60%,  severe LAD/RCA dzs,  LIMA atretic, VG's patent;  d. Myoview 05/2005 (done after cath) EF 72%, no ischemia, fixed ant thinning.  . Carotid artery disease   . Paroxysmal a-fib     a. no longer on coumadin 2/2  h/o falls.  Marland Kitchen HTN (hypertension)   . Hyperlipidemia   . Arthropathy, unspecified, other specified sites   . Sacroiliac joint dysfunction   . Spinal stenosis   . Anemia     iron deficiency  . Hypercalcemia   . Osteoarthrosis, unspecified whether generalized or localized, ankle and foot   . Tibialis tendinitis   . Benign paroxysmal positional vertigo   . Prerenal azotemia     mild  . Anemia, macrocytic   . Raynaud's syndrome   . Impingement syndrome of shoulder region     unspecified area  . Osteoporosis, unspecified   . Shoulder pain, bilateral   . Chronic low back pain   . Arthritis   . Constipation   . PUD (peptic ulcer disease)   . Depression   . Anxiety   . Allergic rhinitis   . Fatigue    Allergies  Allergen Reactions  . Codeine Nausea And Vomiting  . Tramadol Nausea And Vomiting  . Ultracet [Tramadol-Acetaminophen]        Objective:   Physical Exam  Filed Vitals:   03/18/13 1108  BP: 128/40  Pulse: 60  Temp: 97.2 F (36.2 C)  Height: 5\' 7"  (1.702 m)  Weight: 134 lb 4.8 oz (60.918 kg)    Alert and oriented. Skin warm and dry. Oral mucosa is moist.   . Sclera anicteric, conjunctivae is pink. Thyroid not enlarged. No cervical lymphadenopathy. Lungs clear. Heart regular rate and rhythm.  Abdomen is soft. Bowel sounds are positive. No hepatomegaly. No abdominal masses felt. No tenderness.  No edema to lower extremities.      Assessment:    Nausea, but no vomiting. I discussed this case with Dr. Karilyn Cota. Possible gastroparesis.    Plan:     EGD with Dr. Karilyn Cota.

## 2013-03-21 ENCOUNTER — Encounter (HOSPITAL_COMMUNITY): Payer: Self-pay | Admitting: Pharmacy Technician

## 2013-03-27 ENCOUNTER — Encounter (HOSPITAL_COMMUNITY)
Admission: RE | Disposition: A | Discharge status: Home / Self Care | Payer: Self-pay | Source: Ambulatory Visit | Attending: Internal Medicine

## 2013-03-27 ENCOUNTER — Ambulatory Visit (HOSPITAL_COMMUNITY)
Admission: RE | Admit: 2013-03-27 | Discharge: 2013-03-27 | Disposition: A | Discharge status: Home / Self Care | Payer: Medicare PPO | Source: Ambulatory Visit | Attending: Internal Medicine | Admitting: Internal Medicine

## 2013-03-27 ENCOUNTER — Encounter (HOSPITAL_COMMUNITY): Payer: Self-pay

## 2013-03-27 DIAGNOSIS — Z951 Presence of aortocoronary bypass graft: Secondary | ICD-10-CM | POA: Insufficient documentation

## 2013-03-27 DIAGNOSIS — R112 Nausea with vomiting, unspecified: Secondary | ICD-10-CM | POA: Insufficient documentation

## 2013-03-27 DIAGNOSIS — K319 Disease of stomach and duodenum, unspecified: Secondary | ICD-10-CM | POA: Insufficient documentation

## 2013-03-27 DIAGNOSIS — E785 Hyperlipidemia, unspecified: Secondary | ICD-10-CM | POA: Insufficient documentation

## 2013-03-27 DIAGNOSIS — R110 Nausea: Secondary | ICD-10-CM

## 2013-03-27 DIAGNOSIS — K2970 Gastritis, unspecified, without bleeding: Secondary | ICD-10-CM | POA: Insufficient documentation

## 2013-03-27 DIAGNOSIS — Z8711 Personal history of peptic ulcer disease: Secondary | ICD-10-CM | POA: Insufficient documentation

## 2013-03-27 DIAGNOSIS — K2990 Gastroduodenitis, unspecified, without bleeding: Principal | ICD-10-CM

## 2013-03-27 DIAGNOSIS — Z79899 Other long term (current) drug therapy: Secondary | ICD-10-CM | POA: Insufficient documentation

## 2013-03-27 DIAGNOSIS — K2960 Other gastritis without bleeding: Secondary | ICD-10-CM

## 2013-03-27 DIAGNOSIS — D649 Anemia, unspecified: Secondary | ICD-10-CM | POA: Insufficient documentation

## 2013-03-27 DIAGNOSIS — Z7982 Long term (current) use of aspirin: Secondary | ICD-10-CM | POA: Insufficient documentation

## 2013-03-27 DIAGNOSIS — I10 Essential (primary) hypertension: Secondary | ICD-10-CM | POA: Insufficient documentation

## 2013-03-27 HISTORY — PX: ESOPHAGOGASTRODUODENOSCOPY: SHX5428

## 2013-03-27 LAB — IRON AND TIBC
Iron: 24 ug/dL — ABNORMAL LOW (ref 42–135)
Saturation Ratios: 7 % — ABNORMAL LOW (ref 20–55)
TIBC: 327 ug/dL (ref 250–470)
UIBC: 303 ug/dL (ref 125–400)

## 2013-03-27 SURGERY — EGD (ESOPHAGOGASTRODUODENOSCOPY)
Anesthesia: Moderate Sedation

## 2013-03-27 MED ORDER — MEPERIDINE HCL 50 MG/ML IJ SOLN
INTRAMUSCULAR | Status: AC
  Filled 2013-03-27: qty 1

## 2013-03-27 MED ORDER — SODIUM CHLORIDE 0.9 % IV SOLN
INTRAVENOUS | Status: DC
  Administered 2013-03-27: 14:00:00 via INTRAVENOUS

## 2013-03-27 MED ORDER — MIDAZOLAM HCL 5 MG/5ML IJ SOLN
INTRAMUSCULAR | Status: DC | PRN
  Administered 2013-03-27: 1 mg via INTRAVENOUS
  Administered 2013-03-27: 2 mg via INTRAVENOUS

## 2013-03-27 MED ORDER — OMEPRAZOLE 20 MG PO CPDR: 20.0000 mg | DELAYED_RELEASE_CAPSULE | Freq: Every day | ORAL | Status: AC

## 2013-03-27 MED ORDER — MIDAZOLAM HCL 5 MG/5ML IJ SOLN
INTRAMUSCULAR | Status: AC
  Filled 2013-03-27: qty 10

## 2013-03-27 MED ORDER — STERILE WATER FOR IRRIGATION IR SOLN
Status: DC | PRN
  Administered 2013-03-27: 15:00:00

## 2013-03-27 MED ORDER — BUTAMBEN-TETRACAINE-BENZOCAINE 2-2-14 % EX AERO
INHALATION_SPRAY | CUTANEOUS | Status: DC | PRN
  Administered 2013-03-27: 2 via TOPICAL

## 2013-03-27 NOTE — H&P (Signed)
[name] is an 80 y.o. female.   Chief Complaint: Patient is here for EGD. HPI: Patient is a 25-year-old Caucasian female who presents with 5-6 history of postprandial nausea and heaving and intermittent vomiting. She had emesis on day of Christmas. She does not experience heartburn. She denies dysphagia hematemesis or melena. She has had a hematochezia when she is constipated. She has not lost any weight. She does not take OTC NSAIDs other than low-dose aspirin. She has remote history of peptic ulcer disease. She was recently found to have anemia and her B12 and folate levels were normal.  Past Medical History  Diagnosis Date  . CAD (coronary artery disease)     a.  s/p MI;  b. s/p CABG x 4 - LIMA->LAD, VG->DIAG, VG->PLA, VG->PDA;  c. Cath 05/2011 - LM 50-60%,  severe LAD/RCA dzs,  LIMA atretic, VG's patent;  d. Myoview 05/2005 (done after cath) EF 72%, no ischemia, fixed ant thinning.  . Carotid artery disease   . Paroxysmal a-fib     a. no longer on coumadin 2/2 h/o falls.  Marland Kitchen HTN (hypertension)   . Hyperlipidemia   . Arthropathy, unspecified, other specified sites   . Sacroiliac joint dysfunction   . Spinal stenosis   . Anemia     iron deficiency  . Hypercalcemia   . Osteoarthrosis, unspecified whether generalized or localized, ankle and foot   . Tibialis tendinitis   . Benign paroxysmal positional vertigo   . Prerenal azotemia     mild  . Anemia, macrocytic   . Raynaud's syndrome   . Impingement syndrome of shoulder region     unspecified area  . Osteoporosis, unspecified   . Shoulder pain, bilateral   . Chronic low back pain   . Arthritis   . Constipation   . PUD (peptic ulcer disease)   . Depression   . Anxiety   . Allergic rhinitis   . Fatigue     Past Surgical History  Procedure Laterality Date  . Appendectomy    . Cholecystectomy    . Laparotomy      exploratory  . Coronary artery bypass graft      4 grafts - 1998  . Hiatal hernia repair    . Roux-n-y     . Cataract extraction      History reviewed. No pertinent family history. Social History:  reports that she has never smoked. She does not have any smokeless tobacco history on file. She reports that she does not drink alcohol or use illicit drugs.  Allergies:  Allergies  Allergen Reactions  . Codeine Nausea And Vomiting  . Tramadol Nausea And Vomiting  . Ultracet [Tramadol-Acetaminophen]     Medications Prior to Admission  Medication Sig Dispense Refill  . ALPRAZolam (XANAX) 1 MG tablet Take 1.5 mg by mouth daily.       Marland Kitchen aspirin EC 81 MG EC tablet Take 1 tablet (81 mg total) by mouth daily.      . Biotin 1000 MCG tablet Take 1,000 mcg by mouth 2 (two) times daily.       . citalopram (CELEXA) 20 MG tablet Take 20 mg by mouth daily.      . DULoxetine (CYMBALTA) 30 MG capsule Take 30 mg by mouth daily.      Marland Kitchen ipratropium (ATROVENT) 0.06 % nasal spray Place 3 sprays into both nostrils daily.       Marland Kitchen levothyroxine (SYNTHROID, LEVOTHROID) 88 MCG tablet Take 88 mcg by mouth daily.        Marland Kitchen  metoprolol succinate (TOPROL-XL) 25 MG 24 hr tablet Take 12.5 mg by mouth daily.      . NON FORMULARY Take 10,000 mcg by mouth daily. Hair, skin and nails vitamin      . omeprazole (PRILOSEC) 20 MG capsule Take 1 capsule (20 mg total) by mouth 2 (two) times daily.  60 capsule  1  . ramipril (ALTACE) 5 MG capsule TAKE ONE CAPSULE DAILY.  90 capsule  0  . senna (SENOKOT) 8.6 MG tablet Take 1 tablet by mouth daily as needed for constipation.       . nitroGLYCERIN (NITROSTAT) 0.4 MG SL tablet Place 1 tablet (0.4 mg total) under the tongue every 5 (five) minutes as needed for chest pain.  25 tablet  6    No results found for this or any previous visit (from the past 48 hour(s)). No results found.  Review of Systems  Constitutional: Positive for malaise/fatigue.    Blood pressure 170/60, pulse 63, temperature 97.7 F (36.5 C), temperature source Oral, resp. rate 21, SpO2 98.00%. Physical Exam   Constitutional: She appears well-developed and well-nourished.  HENT:  Mouth/Throat: Oropharynx is clear and moist.  Eyes: Conjunctivae are normal. No scleral icterus.  Neck: No thyromegaly present.  Cardiovascular: Normal rate, regular rhythm and normal heart sounds.   No murmur heard. Respiratory: Effort normal and breath sounds normal.  GI: Soft. She exhibits no distension and no mass. Tenderness: mild tenderness at LLQ.  Musculoskeletal: She exhibits no edema.  Lymphadenopathy:    She has no cervical adenopathy.  Neurological: She is alert.  Skin: Skin is warm and dry.     Assessment/Plan Recurrent nausea with sporadic vomiting. Anemia. Diagnostic EGD.  REHMAN,NAJEEB U 03/27/2013, 2:34 PM

## 2013-03-27 NOTE — Op Note (Signed)
Patient briskly hit arm on the siderail and a bruise appeared, patient said she bruises that easily all the time.  A bandage was applied no bleeding noted

## 2013-03-27 NOTE — Op Note (Addendum)
EGD PROCEDURE REPORT  PATIENT:  [name]  MR#:  [id number] Birthdate:  1924-01-25, Marcelina Morel[age] y.o., female Endoscopist:  Dr. Malissa HippoNajeeb U. Vandora Jaskulski, MD Referred By:  Dr. Dwana MelenaZack Hall, MD  Procedure Date: 03/27/2013  Procedure:   EGD  Indications:  Patient is an 80 year old Caucasian female who has remote history of peptic ulcer disease as well as history of GERD status post Nissen several years ago who presents with 5-6 month history of nausea and heaving most often in the afternoon and she also has sporadic episodes of vomiting and she had one on Christmas day. She was also found to have anemia with  normal B12 level. No history of melena or frank rectal bleeding. She has occasional hematochezia when she is constipated. She denies weight loss. Other lab studies include normal comprehensive chemistry panel from 02/14/2013. She is undergoing diagnostic EGD.            Informed Consent:  The risks, benefits, alternatives & imponderables which include, but are not limited to, bleeding, infection, perforation, drug reaction and potential missed lesion have been reviewed.  The potential for biopsy, lesion removal, esophageal dilation, etc. have also been discussed.  Questions have been answered.  All parties agreeable.  Please see history & physical in medical record for more information.  Medications:  Versed 3 mg IV Cetacaine spray topically for oropharyngeal anesthesia  Description of procedure:  The endoscope was introduced through the mouth and advanced to the second portion of the duodenum without difficulty or limitations. The mucosal surfaces were surveyed very carefully during advancement of the scope and upon withdrawal.  Findings:  Esophagus:  Mucosa of the esophagus was normal. GE junction was unremarkable. GEJ:  39 cm Hiatus:  41 cm Stomach:  There was small amount of bile in the stomach but no food debris was present. Stomach distended very well with insufflation. Gastric folds were prominent  with erythema in the mid section. There was erythema and edema to antral mucosa. No erosions or ulcers were present. Pyloric channel was patent. Angularis fundus and cardia was examined by retroflex the scope and no abnormalities noted. Duodenum:  Normal bulbar and post bulbar mucosa.  Therapeutic/Diagnostic Maneuvers Performed:   Biopsy taken from the gastric folds for routine histology.  Complications:  None  Impression: Small sliding heart hernia without evidence of erosive esophagitis. Nonerosive gastritis with prominent gastric folds. Biopsy taken for routine histology.  Comment; No abnormality noted on this exam to account for patient's symptoms. If biopsy is inconclusive she will need further evaluation.  Recommendations:  Decrease omeprazole 20 mg by mouth every morning. Will check H&H, serum iron TIBC and ferritin levels today.  Carrick Rijos U  03/27/2013  3:02 PM  CC: Dr. Catalina PizzaHALL, ZACH, MD & Dr. Bonnetta BarryNo ref. provider found

## 2013-03-27 NOTE — Discharge Instructions (Addendum)
Can decrease the omeprazole to 20 mg by mouth daily before breakfast. Resume other medications as before. No driving for 24 hours. Physician will contact you with results of blood work and biopsy. Gastritis, Adult Gastritis is soreness and swelling (inflammation) of the lining of the stomach. Gastritis can develop as a sudden onset (acute) or long-term (chronic) condition. If gastritis is not treated, it can lead to stomach bleeding and ulcers. CAUSES  Gastritis occurs when the stomach lining is weak or damaged. Digestive juices from the stomach then inflame the weakened stomach lining. The stomach lining may be weak or damaged due to viral or bacterial infections. One common bacterial infection is the Helicobacter pylori infection. Gastritis can also result from excessive alcohol consumption, taking certain medicines, or having too much acid in the stomach.  SYMPTOMS  In some cases, there are no symptoms. When symptoms are present, they may include:  Pain or a burning sensation in the upper abdomen.  Nausea.  Vomiting.  An uncomfortable feeling of fullness after eating. DIAGNOSIS  Your caregiver may suspect you have gastritis based on your symptoms and a physical exam. To determine the cause of your gastritis, your caregiver may perform the following:  Blood or stool tests to check for the H pylori bacterium.  Gastroscopy. A thin, flexible tube (endoscope) is passed down the esophagus and into the stomach. The endoscope has a light and camera on the end. Your caregiver uses the endoscope to view the inside of the stomach.  Taking a tissue sample (biopsy) from the stomach to examine under a microscope. TREATMENT  Depending on the cause of your gastritis, medicines may be prescribed. If you have a bacterial infection, such as an H pylori infection, antibiotics may be given. If your gastritis is caused by too much acid in the stomach, H2 blockers or antacids may be given. Your caregiver may  recommend that you stop taking aspirin, ibuprofen, or other nonsteroidal anti-inflammatory drugs (NSAIDs). HOME CARE INSTRUCTIONS  Only take over-the-counter or prescription medicines as directed by your caregiver.  If you were given antibiotic medicines, take them as directed. Finish them even if you start to feel better.  Drink enough fluids to keep your urine clear or pale yellow.  Avoid foods and drinks that make your symptoms worse, such as:  Caffeine or alcoholic drinks.  Chocolate.  Peppermint or mint flavorings.  Garlic and onions.  Spicy foods.  Citrus fruits, such as oranges, lemons, or limes.  Tomato-based foods such as sauce, chili, salsa, and pizza.  Fried and fatty foods.  Eat small, frequent meals instead of large meals. SEEK IMMEDIATE MEDICAL CARE IF:   You have black or dark red stools.  You vomit blood or material that looks like coffee grounds.  You are unable to keep fluids down.  Your abdominal pain gets worse.  You have a fever.  You do not feel better after 1 week.  You have any other questions or concerns. MAKE SURE YOU:  Understand these instructions.  Will watch your condition.  Will get help right away if you are not doing well or get worse. Document Released: 02/07/2001 Document Revised: 08/15/2011 Document Reviewed: 03/29/2011 [facility] Patient Information 2014 [place], [place].   Esophagogastroduodenoscopy Care After Refer to this sheet in the next few weeks. These instructions provide you with information on caring for yourself after your procedure. Your caregiver may also give you more specific instructions. Your treatment has been planned according to current medical practices, but problems sometimes occur. Call  your caregiver if you have any problems or questions after your procedure.  HOME CARE INSTRUCTIONS  Do not eat or drink anything until the numbing medicine (local anesthetic) has worn off and your gag reflex has  returned. You will know that the local anesthetic has worn off when you can swallow comfortably.  Do not drive for 12 hours after the procedure or as directed by your caregiver.  Only take medicines as directed by your caregiver. SEEK MEDICAL CARE IF:   You cannot stop coughing.  You are not urinating at all or less than usual. SEEK IMMEDIATE MEDICAL CARE IF:  You have difficulty swallowing.  You cannot eat or drink.  You have worsening throat or chest pain.  You have dizziness, lightheadedness, or you faint.  You have nausea or vomiting.  You have chills.  You have a fever.  You have severe abdominal pain.  You have black, tarry, or bloody stools. Document Released: 01/31/2012 Document Reviewed: 01/31/2012 [facility] Patient Information 2014 [place], [place].

## 2013-03-28 LAB — FERRITIN: FERRITIN: 19 ng/mL (ref 10–291)

## 2013-03-31 ENCOUNTER — Encounter (HOSPITAL_COMMUNITY): Payer: Self-pay | Admitting: Internal Medicine

## 2013-04-01 ENCOUNTER — Other Ambulatory Visit (INDEPENDENT_AMBULATORY_CARE_PROVIDER_SITE_OTHER): Payer: Self-pay | Admitting: Internal Medicine

## 2013-04-01 DIAGNOSIS — G8929 Other chronic pain: Secondary | ICD-10-CM

## 2013-04-01 DIAGNOSIS — K219 Gastro-esophageal reflux disease without esophagitis: Secondary | ICD-10-CM

## 2013-04-01 DIAGNOSIS — K279 Peptic ulcer, site unspecified, unspecified as acute or chronic, without hemorrhage or perforation: Secondary | ICD-10-CM

## 2013-04-01 DIAGNOSIS — R1013 Epigastric pain: Principal | ICD-10-CM

## 2013-04-01 DIAGNOSIS — R110 Nausea: Secondary | ICD-10-CM

## 2013-04-02 ENCOUNTER — Telehealth (INDEPENDENT_AMBULATORY_CARE_PROVIDER_SITE_OTHER): Payer: Self-pay | Admitting: *Deleted

## 2013-04-02 DIAGNOSIS — Z0189 Encounter for other specified special examinations: Secondary | ICD-10-CM

## 2013-04-02 NOTE — Telephone Encounter (Addendum)
[facility] -- she is having CT A/P 04/08/13 -- patient states she will go Friday for blood work

## 2013-04-02 NOTE — Telephone Encounter (Signed)
[facility]

## 2013-04-02 NOTE — Telephone Encounter (Signed)
Patient is to have a diagnostic on 04/08/13.

## 2013-04-04 LAB — CREATININE, SERUM: Creat: 1.37 mg/dL — ABNORMAL HIGH (ref 0.50–1.10)

## 2013-04-08 ENCOUNTER — Ambulatory Visit (HOSPITAL_COMMUNITY)
Admission: RE | Admit: 2013-04-08 | Discharge: 2013-04-08 | Disposition: A | Discharge status: Home / Self Care | Payer: Medicare PPO | Source: Ambulatory Visit | Attending: Internal Medicine | Admitting: Internal Medicine

## 2013-04-08 ENCOUNTER — Ambulatory Visit (HOSPITAL_COMMUNITY): Payer: Medicare PPO

## 2013-04-08 ENCOUNTER — Encounter (INDEPENDENT_AMBULATORY_CARE_PROVIDER_SITE_OTHER): Payer: Self-pay | Admitting: *Deleted

## 2013-04-08 DIAGNOSIS — K219 Gastro-esophageal reflux disease without esophagitis: Secondary | ICD-10-CM

## 2013-04-08 DIAGNOSIS — G8929 Other chronic pain: Secondary | ICD-10-CM

## 2013-04-08 DIAGNOSIS — R110 Nausea: Secondary | ICD-10-CM

## 2013-04-08 DIAGNOSIS — R109 Unspecified abdominal pain: Secondary | ICD-10-CM | POA: Insufficient documentation

## 2013-04-08 DIAGNOSIS — K439 Ventral hernia without obstruction or gangrene: Secondary | ICD-10-CM | POA: Insufficient documentation

## 2013-04-08 DIAGNOSIS — K279 Peptic ulcer, site unspecified, unspecified as acute or chronic, without hemorrhage or perforation: Secondary | ICD-10-CM

## 2013-04-08 DIAGNOSIS — K5730 Diverticulosis of large intestine without perforation or abscess without bleeding: Secondary | ICD-10-CM | POA: Insufficient documentation

## 2013-04-08 DIAGNOSIS — R1013 Epigastric pain: Secondary | ICD-10-CM

## 2013-04-08 MED ORDER — IOHEXOL 300 MG/ML  SOLN
80.0000 mL | Freq: Once | INTRAMUSCULAR | Status: AC | PRN
  Administered 2013-04-08: 80 mL via INTRAVENOUS

## 2013-04-25 ENCOUNTER — Encounter (HOSPITAL_COMMUNITY): Payer: Self-pay | Admitting: Emergency Medicine

## 2013-04-25 ENCOUNTER — Emergency Department (HOSPITAL_COMMUNITY): Payer: Medicare PPO

## 2013-04-25 ENCOUNTER — Inpatient Hospital Stay (HOSPITAL_COMMUNITY)
Admission: EM | Admit: 2013-04-25 | Discharge: 2013-04-27 | DRG: 326 | Disposition: E | Payer: Medicare PPO | Attending: Surgery | Admitting: Surgery

## 2013-04-25 DIAGNOSIS — R6520 Severe sepsis without septic shock: Secondary | ICD-10-CM

## 2013-04-25 DIAGNOSIS — I4891 Unspecified atrial fibrillation: Secondary | ICD-10-CM | POA: Diagnosis present

## 2013-04-25 DIAGNOSIS — M810 Age-related osteoporosis without current pathological fracture: Secondary | ICD-10-CM | POA: Diagnosis present

## 2013-04-25 DIAGNOSIS — J982 Interstitial emphysema: Secondary | ICD-10-CM | POA: Diagnosis present

## 2013-04-25 DIAGNOSIS — I252 Old myocardial infarction: Secondary | ICD-10-CM

## 2013-04-25 DIAGNOSIS — R0902 Hypoxemia: Secondary | ICD-10-CM | POA: Diagnosis not present

## 2013-04-25 DIAGNOSIS — I2510 Atherosclerotic heart disease of native coronary artery without angina pectoris: Secondary | ICD-10-CM | POA: Diagnosis present

## 2013-04-25 DIAGNOSIS — K3189 Other diseases of stomach and duodenum: Secondary | ICD-10-CM | POA: Diagnosis present

## 2013-04-25 DIAGNOSIS — Z9089 Acquired absence of other organs: Secondary | ICD-10-CM

## 2013-04-25 DIAGNOSIS — K559 Vascular disorder of intestine, unspecified: Secondary | ICD-10-CM | POA: Diagnosis present

## 2013-04-25 DIAGNOSIS — Z9849 Cataract extraction status, unspecified eye: Secondary | ICD-10-CM

## 2013-04-25 DIAGNOSIS — Z951 Presence of aortocoronary bypass graft: Secondary | ICD-10-CM

## 2013-04-25 DIAGNOSIS — K659 Peritonitis, unspecified: Secondary | ICD-10-CM | POA: Diagnosis present

## 2013-04-25 DIAGNOSIS — I10 Essential (primary) hypertension: Secondary | ICD-10-CM | POA: Diagnosis present

## 2013-04-25 DIAGNOSIS — R6521 Severe sepsis with septic shock: Secondary | ICD-10-CM

## 2013-04-25 DIAGNOSIS — Z8711 Personal history of peptic ulcer disease: Secondary | ICD-10-CM

## 2013-04-25 DIAGNOSIS — N179 Acute kidney failure, unspecified: Secondary | ICD-10-CM | POA: Diagnosis present

## 2013-04-25 DIAGNOSIS — E785 Hyperlipidemia, unspecified: Secondary | ICD-10-CM | POA: Diagnosis present

## 2013-04-25 DIAGNOSIS — A419 Sepsis, unspecified organism: Secondary | ICD-10-CM | POA: Diagnosis not present

## 2013-04-25 DIAGNOSIS — J9600 Acute respiratory failure, unspecified whether with hypoxia or hypercapnia: Secondary | ICD-10-CM | POA: Diagnosis not present

## 2013-04-25 DIAGNOSIS — E872 Acidosis, unspecified: Secondary | ICD-10-CM | POA: Diagnosis present

## 2013-04-25 DIAGNOSIS — I7300 Raynaud's syndrome without gangrene: Secondary | ICD-10-CM | POA: Diagnosis present

## 2013-04-25 DIAGNOSIS — R7309 Other abnormal glucose: Secondary | ICD-10-CM | POA: Diagnosis present

## 2013-04-25 DIAGNOSIS — R4182 Altered mental status, unspecified: Secondary | ICD-10-CM | POA: Diagnosis present

## 2013-04-25 DIAGNOSIS — G8929 Other chronic pain: Secondary | ICD-10-CM | POA: Diagnosis present

## 2013-04-25 DIAGNOSIS — M545 Low back pain, unspecified: Secondary | ICD-10-CM | POA: Diagnosis present

## 2013-04-25 DIAGNOSIS — K251 Acute gastric ulcer with perforation: Principal | ICD-10-CM | POA: Diagnosis present

## 2013-04-25 DIAGNOSIS — K660 Peritoneal adhesions (postprocedural) (postinfection): Secondary | ICD-10-CM | POA: Diagnosis present

## 2013-04-25 DIAGNOSIS — K668 Other specified disorders of peritoneum: Secondary | ICD-10-CM | POA: Diagnosis present

## 2013-04-25 DIAGNOSIS — Z515 Encounter for palliative care: Secondary | ICD-10-CM

## 2013-04-25 DIAGNOSIS — K439 Ventral hernia without obstruction or gangrene: Secondary | ICD-10-CM | POA: Diagnosis present

## 2013-04-25 DIAGNOSIS — Z66 Do not resuscitate: Secondary | ICD-10-CM | POA: Diagnosis present

## 2013-04-25 DIAGNOSIS — K560 Paralytic ileus: Secondary | ICD-10-CM | POA: Diagnosis present

## 2013-04-25 DIAGNOSIS — R1013 Epigastric pain: Secondary | ICD-10-CM | POA: Diagnosis present

## 2013-04-25 DIAGNOSIS — Z7982 Long term (current) use of aspirin: Secondary | ICD-10-CM

## 2013-04-25 HISTORY — DX: Vascular disorder of intestine, unspecified: K55.9

## 2013-04-25 HISTORY — DX: Diverticulosis of large intestine without perforation or abscess without bleeding: K57.30

## 2013-04-25 LAB — COMPREHENSIVE METABOLIC PANEL
ALT: 33 U/L (ref 0–35)
AST: 88 U/L — AB (ref 0–37)
Albumin: 4.2 g/dL (ref 3.5–5.2)
Alkaline Phosphatase: 100 U/L (ref 39–117)
BILIRUBIN TOTAL: 0.6 mg/dL (ref 0.3–1.2)
BUN: 26 mg/dL — ABNORMAL HIGH (ref 6–23)
CHLORIDE: 94 meq/L — AB (ref 96–112)
CO2: 26 mEq/L (ref 19–32)
Calcium: 9.4 mg/dL (ref 8.4–10.5)
Creatinine, Ser: 1.22 mg/dL — ABNORMAL HIGH (ref 0.50–1.10)
GFR calc Af Amer: 45 mL/min — ABNORMAL LOW (ref >90)
GFR calc non Af Amer: 39 mL/min — ABNORMAL LOW (ref >90)
Glucose, Bld: 202 mg/dL — ABNORMAL HIGH (ref 70–99)
Potassium: 4.7 mEq/L (ref 3.7–5.3)
SODIUM: 136 meq/L — AB (ref 137–147)
Total Protein: 8.6 g/dL — ABNORMAL HIGH (ref 6.0–8.3)

## 2013-04-25 LAB — CBC WITH DIFFERENTIAL/PLATELET
BASOS ABS: 0.1 10*3/uL (ref 0.0–0.1)
Basophils Relative: 0 % (ref 0–1)
Eosinophils Absolute: 0.2 10*3/uL (ref 0.0–0.7)
Eosinophils Relative: 2 % (ref 0–5)
HCT: 41 % (ref 36.0–46.0)
Hemoglobin: 13.1 g/dL (ref 12.0–15.0)
Lymphocytes Relative: 10 % — ABNORMAL LOW (ref 12–46)
Lymphs Abs: 1.5 10*3/uL (ref 0.7–4.0)
MCH: 31.1 pg (ref 26.0–34.0)
MCHC: 32 g/dL (ref 30.0–36.0)
MCV: 97.4 fL (ref 78.0–100.0)
Monocytes Absolute: 0.7 10*3/uL (ref 0.1–1.0)
Monocytes Relative: 5 % (ref 3–12)
Neutro Abs: 11.7 10*3/uL — ABNORMAL HIGH (ref 1.7–7.7)
Neutrophils Relative %: 83 % — ABNORMAL HIGH (ref 43–77)
Platelets: 289 10*3/uL (ref 150–400)
RBC: 4.21 MIL/uL (ref 3.87–5.11)
RDW: 16.3 % — AB (ref 11.5–15.5)
WBC: 14.2 10*3/uL — ABNORMAL HIGH (ref 4.0–10.5)

## 2013-04-25 LAB — TROPONIN I: Troponin I: <0.3 ng/mL (ref <0.30)

## 2013-04-25 LAB — LIPASE, BLOOD: Lipase: >3000 U/L — ABNORMAL HIGH (ref 11–59)

## 2013-04-25 LAB — LACTIC ACID, PLASMA: Lactic Acid, Venous: 3.8 mmol/L — ABNORMAL HIGH (ref 0.5–2.2)

## 2013-04-25 MED ORDER — ROCURONIUM BROMIDE 50 MG/5ML IV SOLN
INTRAVENOUS | Status: AC
  Filled 2013-04-25: qty 2

## 2013-04-25 MED ORDER — SUCCINYLCHOLINE CHLORIDE 20 MG/ML IJ SOLN
INTRAMUSCULAR | Status: AC
  Administered 2013-04-26: 150 mg via INTRAVENOUS
  Filled 2013-04-25: qty 1

## 2013-04-25 MED ORDER — PIPERACILLIN-TAZOBACTAM 3.375 G IVPB 30 MIN
3.3750 g | Freq: Once | INTRAVENOUS | Status: DC
  Filled 2013-04-25 (×2): qty 50

## 2013-04-25 MED ORDER — ETOMIDATE 2 MG/ML IV SOLN
INTRAVENOUS | Status: AC
  Administered 2013-04-26: 20 mg via INTRAVENOUS
  Filled 2013-04-25: qty 20

## 2013-04-25 MED ORDER — HYDROMORPHONE HCL PF 1 MG/ML IJ SOLN
1.0000 mg | Freq: Once | INTRAMUSCULAR | Status: AC
Start: 2013-04-25 — End: 2013-04-25
  Administered 2013-04-25: 0.5 mg via INTRAVENOUS
  Filled 2013-04-25: qty 1

## 2013-04-25 MED ORDER — DIPHENHYDRAMINE HCL 50 MG/ML IJ SOLN
INTRAMUSCULAR | Status: AC
  Filled 2013-04-25: qty 1

## 2013-04-25 MED ORDER — ONDANSETRON HCL 4 MG/2ML IJ SOLN
4.0000 mg | Freq: Once | INTRAMUSCULAR | Status: AC
  Administered 2013-04-25: 4 mg via INTRAVENOUS
  Filled 2013-04-25: qty 2

## 2013-04-25 MED ORDER — LIDOCAINE HCL (CARDIAC) 20 MG/ML IV SOLN
INTRAVENOUS | Status: AC
  Filled 2013-04-25: qty 5

## 2013-04-25 MED ORDER — SODIUM CHLORIDE 0.9 % IV BOLUS (SEPSIS)
1000.0000 mL | Freq: Once | INTRAVENOUS | Status: AC
  Administered 2013-04-26: 1000 mL via INTRAVENOUS

## 2013-04-25 NOTE — ED Provider Notes (Signed)
Patient seen/examined in the Emergency Department in conjunction with Midlevel Provider Patient reports abd pain Exam : abd is distended and rigid.  She has subQ emphysema to neck Plan: d/w dr Lovell Sheehanjenkins, on call for surgery.  Due to complexity of case, [facility] Patient/family agreeable with plan Will d/w Edith Endave surgery Pt is critically ill at this time    Joya Gaskinsonald W Julyan Gales, MD 04/19/2013 2352

## 2013-04-25 NOTE — ED Provider Notes (Signed)
CSN: [id number]     Arrival date & time 2013-05-09  2155 History   First MD Initiated Contact with Patient 05-09-13 2311     Chief Complaint  Patient presents with  . Abdominal Pain     (Consider location/radiation/quality/duration/timing/severity/associated sxs/prior Treatment) Patient is a 80 y.o. female presenting with abdominal pain. The history is provided by the patient and medical records. No language interpreter was used.  Abdominal Pain Associated symptoms: chest pain and nausea   Associated symptoms: no constipation, no cough, no diarrhea, no dysuria, no fatigue, no fever, no hematuria, no shortness of breath and no vomiting     [name] is a 80 y.o. female  with a hx of her disease, A. fib, hypertension, anemia, chronic low back pain, peptic ulcer disease presents to the Emergency Department complaining of acute, persistent, progressively worsening right upper quadrant abdominal pain and epigastric pain onset one hour prior to arrival. Associated symptoms include abdominal distention, chest pain, shortness of breath.  Nothing makes it better and nothing makes it worse.  A large portion of the patient's history is obtained from her daughter as the patient is in severe pain. Patient's daughter reports numerous abdominal surgical procedures including hiatal hernia, Roux-en-Y and history of a bowel perforation secondary to an exploratory laparotomy.  Patient also with history of appendectomy and cholecystectomy. Patient reports associated nausea without vomiting tonight.   Daughter reports the patient cried through most of the transport here. Pt denies fever, chills, vomiting, diarrhea weakness, dizziness, syncope.     Past Medical History  Diagnosis Date  . CAD (coronary artery disease)     a.  s/p MI;  b. s/p CABG x 4 - LIMA->LAD, VG->DIAG, VG->PLA, VG->PDA;  c. Cath 05/2011 - LM 50-60%,  severe LAD/RCA dzs,  LIMA atretic, VG's patent;  d. Myoview 05/2005 (done after cath) EF 72%, no  ischemia, fixed ant thinning.  . Carotid artery disease   . Paroxysmal a-fib     a. no longer on coumadin 2/2 h/o falls.  Marland Kitchen HTN (hypertension)   . Hyperlipidemia   . Arthropathy, unspecified, other specified sites   . Sacroiliac joint dysfunction   . Spinal stenosis   . Anemia     iron deficiency  . Hypercalcemia   . Osteoarthrosis, unspecified whether generalized or localized, ankle and foot   . Tibialis tendinitis   . Benign paroxysmal positional vertigo   . Prerenal azotemia     mild  . Anemia, macrocytic   . Raynaud's syndrome   . Impingement syndrome of shoulder region     unspecified area  . Osteoporosis, unspecified   . Shoulder pain, bilateral   . Chronic low back pain   . Arthritis   . Constipation   . PUD (peptic ulcer disease)   . Depression   . Anxiety   . Allergic rhinitis   . Fatigue   . ISCHEMIC COLITIS 09/24/2006    Qualifier: Diagnosis of  By: Julian Reil LPN, Sherrie    . Nausea with vomiting 01/13/2008    Qualifier: Diagnosis of  By: Erby Pian MD, Remi Haggard    . Diverticulosis of colon 2008     Colonoscopy    Past Surgical History  Procedure Laterality Date  . Appendectomy    . Cholecystectomy    . Laparotomy      exploratory  . Coronary artery bypass graft      4 grafts - 1998  . Hiatal hernia repair  1998  . Cataract extraction    .  Esophagogastroduodenoscopy N/A 03/27/2013    Procedure: ESOPHAGOGASTRODUODENOSCOPY (EGD);  Surgeon: Malissa Hippo, MD;  Location: [facility];  Service: Endoscopy;  Laterality: N/A;  255-moved to 200 Ann to notify pt   No family history on file. History  Substance Use Topics  . Smoking status: Never Smoker   . Smokeless tobacco: Not on file  . Alcohol Use: No   OB History   Grav Para Term Preterm Abortions TAB SAB Ect Mult Living                 Review of Systems  Constitutional: Negative for fever, diaphoresis, appetite change, fatigue and unexpected weight change.  HENT: Negative for mouth sores.    Eyes: Negative for visual disturbance.  Respiratory: Negative for cough, chest tightness, shortness of breath and wheezing.   Cardiovascular: Positive for chest pain.  Gastrointestinal: Positive for nausea and abdominal pain. Negative for vomiting, diarrhea and constipation.  Endocrine: Negative for polydipsia, polyphagia and polyuria.  Genitourinary: Negative for dysuria, urgency, frequency and hematuria.  Musculoskeletal: Negative for back pain and neck stiffness.  Skin: Negative for rash.  Allergic/Immunologic: Negative for immunocompromised state.  Neurological: Negative for syncope, light-headedness and headaches.  Hematological: Does not bruise/bleed easily.  Psychiatric/Behavioral: Negative for sleep disturbance. The patient is not nervous/anxious.       Allergies  Codeine; Tramadol; and Ultracet  Home Medications   Current Outpatient Rx  Name  Route  Sig  Dispense  Refill  . ALPRAZolam (XANAX) 1 MG tablet   Oral   Take 1.5 mg by mouth daily.          Marland Kitchen aspirin EC 81 MG EC tablet   Oral   Take 1 tablet (81 mg total) by mouth daily.         . Biotin 1000 MCG tablet   Oral   Take 1,000 mcg by mouth 2 (two) times daily.          . citalopram (CELEXA) 20 MG tablet   Oral   Take 20 mg by mouth daily.         . DULoxetine (CYMBALTA) 30 MG capsule   Oral   Take 30 mg by mouth daily.         Marland Kitchen ipratropium (ATROVENT) 0.06 % nasal spray   Each Nare   Place 3 sprays into both nostrils daily.          Marland Kitchen levothyroxine (SYNTHROID, LEVOTHROID) 88 MCG tablet   Oral   Take 88 mcg by mouth daily.           . metoprolol succinate (TOPROL-XL) 25 MG 24 hr tablet   Oral   Take 12.5 mg by mouth daily.         . nitroGLYCERIN (NITROSTAT) 0.4 MG SL tablet   Sublingual   Place 1 tablet (0.4 mg total) under the tongue every 5 (five) minutes as needed for chest pain.   25 tablet   6   . NON FORMULARY   Oral   Take 10,000 mcg by mouth daily. Pittman, skin and  nails vitamin         . EXPIRED: omeprazole (PRILOSEC) 20 MG capsule   Oral   Take 1 capsule (20 mg total) by mouth 2 (two) times daily.   60 capsule   1   . omeprazole (PRILOSEC) 20 MG capsule   Oral   Take 1 capsule (20 mg total) by mouth daily.   30 capsule   5   .  ramipril (ALTACE) 5 MG capsule      TAKE ONE CAPSULE DAILY.   90 capsule   0     PATIENT NEEDS TO CALL OUR OFFICE TO SCHEDULE A FOL ...   . senna (SENOKOT) 8.6 MG tablet   Oral   Take 1 tablet by mouth daily as needed for constipation.           BP 143/67  Pulse 88  Temp(Src) 97.5 F (36.4 C) (Oral)  Resp 22  Ht 5\' 7"  (1.702 m)  Wt 135 lb (61.236 kg)  BMI 21.14 kg/m2  SpO2 95% Physical Exam  Nursing note and vitals reviewed. Constitutional: She is oriented to person, place, and time. She appears well-developed and well-nourished. She appears distressed.  Awake, alert,  Toxic-appearing  HENT:  Head: Normocephalic and atraumatic.  Mouth/Throat: Oropharynx is clear and moist. No oropharyngeal exudate.  Eyes: Conjunctivae are normal. No scleral icterus.  Neck: Normal range of motion. Neck supple.  Cardiovascular: Normal rate, regular rhythm, normal heart sounds and intact distal pulses.   No murmur heard. No tachycardia Capillary refill less than 3 seconds Intact distal pulses  Pulmonary/Chest: Effort normal and breath sounds normal. No respiratory distress. She has no wheezes.  Abdominal: Soft. Bowel sounds are normal. She exhibits distension. She exhibits no mass. There is tenderness. There is rigidity and guarding. There is no rebound.  Tenderness throughout significant abdominal distention and rigidity Numerous well-healed surgical incisions  Musculoskeletal: Normal range of motion. She exhibits no edema.  Neurological: She is alert and oriented to person, place, and time. No cranial nerve deficit. She exhibits normal muscle tone. Coordination normal.  Speech is clear and goal oriented Moves  extremities without ataxia  Skin: Skin is warm and dry. No rash noted. She is not diaphoretic. No erythema.  Psychiatric: She has a normal mood and affect.    ED Course  Procedures (including critical care time) Labs Review Labs Reviewed  CBC WITH DIFFERENTIAL - Abnormal; Notable for the following:    WBC 14.2 (*)    RDW 16.3 (*)    Neutrophils Relative % 83 (*)    Neutro Abs 11.7 (*)    Lymphocytes Relative 10 (*)    All other components within normal limits  COMPREHENSIVE METABOLIC PANEL - Abnormal; Notable for the following:    Sodium 136 (*)    Chloride 94 (*)    Glucose, Bld 202 (*)    BUN 26 (*)    Creatinine, Ser 1.22 (*)    Total Protein 8.6 (*)    AST 88 (*)    GFR calc non Af Amer 39 (*)    GFR calc Af Amer 45 (*)    All other components within normal limits  LIPASE, BLOOD - Abnormal; Notable for the following:    Lipase >3000 (*)    All other components within normal limits  LACTIC ACID, PLASMA - Abnormal; Notable for the following:    Lactic Acid, Venous 3.8 (*)    All other components within normal limits  TROPONIN I   Imaging Review Ct Chest W Contrast  25-May-2013   CLINICAL DATA:  Shaft epigastric pain, nausea. Prior cholecystectomy, appendectomy, hiatal hernia repair, and Roux-en-Y.  EXAM: CT CHEST, ABDOMEN, AND PELVIS WITH CONTRAST  TECHNIQUE: Multidetector CT imaging of the chest, abdomen and pelvis was performed following the standard protocol during bolus administration of intravenous contrast.  CONTRAST:  50mL OMNIPAQUE IOHEXOL 300 MG/ML SOLN, OMNIPAQUE IOHEXOL 300 MG/ML SOLN  COMPARISON:  Chest/abdominal radiographs dated 04/23/2013. CT abdomen pelvis dated 04/08/2013.  FINDINGS: CT CHEST FINDINGS  Extensive subcutaneous emphysema in the bilateral neck, bilateral chest wall, and right mid/lower back.  Moderate pneumomediastinum and possible trace pneumopericardium (series 3/ image 34).  Possible tiny right anterior pneumothorax (series 3/ image 28).   Mild patchy opacities in the bilateral lower lobes, likely atelectasis. Trace bilateral pleural effusions, left greater than right.  The heart is normal in size. Coronary atherosclerosis. Postsurgical changes related to prior CABG. Atherosclerotic calcifications of the aortic arch.  Fluid/debris within the lower esophagus.  Visualized osseous structures are within normal limits.  CT ABDOMEN AND PELVIS FINDINGS  Postsurgical changes at the GE junction related to Nissen fundoplication.  Suspected discontinuity/frank perforation involving the posterior aspect of the gastric fundus (series 3/image 53; series 4/image 54). Associated gas and layering fluid/debris within an 18.8 x 11.8 x 19.9 cm extraluminal collection in the left mid abdomen (series 3/ image 67). Associated large volume mesenteric gas within the right mid abdomen (series 3/ image 61), moderate bilateral retroperitoneal gas (series 2/ image 79), moderate periportal gas, and portal venous gas.  Liver is otherwise unremarkable. Spleen, pancreas, and adrenal glands are grossly unremarkable.  Status post cholecystectomy.  Bilateral renal atrophy with scattered probable tiny renal cysts. No hydronephrosis.  Multiple mildly prominent loops of small and large bowel, favored to reflect secondary adynamic ileus.  Multiple small midline/paramidline ventral hernias.  No abdominopelvic ascites.  No suspicious abdominopelvic lymphadenopathy.  Uterus and bilateral ovaries are unremarkable.  Bladder is decompressed with indwelling Foley catheter.  Degenerative changes of the lumbar spine, most prominent at L1-2.  IMPRESSION: Suspected discontinuity/frank perforation involving the posterior aspect of the gastric fundus. Associated gas and layering debris within a 19.9 cm extraluminal collection in the left mid abdomen.  Associated pneumoperitoneum, retroperitoneum, and portal venous gas. Associated pneumomediastinum, possible trace pneumopericardium, and possible tiny  right anterior pneumothorax. Extensive subcutaneous emphysema involving the bilateral neck, bilateral chest, and right back.  Multiple mildly prominent loops of small and large bowel, favored to reflect secondary adynamic ileus.  Postsurgical changes at the GE junction related to Nissen fundoplication. Fluid within the distal esophagus.  Additional ancillary findings as above.  Critical value/emergent results were called by telephone at the time of interpretation on 04/14/2013 at 12:51 AM to Dr. Zadie RhineNALD WICKLINE, who verbally acknowledged these results. Findings were also discussed with Dr. Michaell CowingGross on 04/19/2013 at 1248 hr.   Electronically Signed   By: Charline BillsSriyesh  Krishnan M.D.   On: 03/30/2013 01:08   Ct Abdomen Pelvis W Contrast  04/13/2013   CLINICAL DATA:  Shaft epigastric pain, nausea. Prior cholecystectomy, appendectomy, hiatal hernia repair, and Roux-en-Y.  EXAM: CT CHEST, ABDOMEN, AND PELVIS WITH CONTRAST  TECHNIQUE: Multidetector CT imaging of the chest, abdomen and pelvis was performed following the standard protocol during bolus administration of intravenous contrast.  CONTRAST:  50mL OMNIPAQUE IOHEXOL 300 MG/ML SOLN, 100mL OMNIPAQUE IOHEXOL 300 MG/ML SOLN  COMPARISON:  Chest/abdominal radiographs dated 04/08/2013. CT abdomen pelvis dated 04/08/2013.  FINDINGS: CT CHEST FINDINGS  Extensive subcutaneous emphysema in the bilateral neck, bilateral chest wall, and right mid/lower back.  Moderate pneumomediastinum and possible trace pneumopericardium (series 3/ image 34).  Possible tiny right anterior pneumothorax (series 3/ image 28).  Mild patchy opacities in the bilateral lower lobes, likely atelectasis. Trace bilateral pleural effusions, left greater than right.  The heart is normal in size. Coronary atherosclerosis. Postsurgical changes related to prior CABG. Atherosclerotic calcifications of the aortic arch.  Fluid/debris within the lower esophagus.  Visualized osseous structures are within normal limits.   CT ABDOMEN AND PELVIS FINDINGS  Postsurgical changes at the GE junction related to Nissen fundoplication.  Suspected discontinuity/frank perforation involving the posterior aspect of the gastric fundus (series 3/image 53; series 4/image 54). Associated gas and layering fluid/debris within an 18.8 x 11.8 x 19.9 cm extraluminal collection in the left mid abdomen (series 3/ image 67). Associated large volume mesenteric gas within the right mid abdomen (series 3/ image 61), moderate bilateral retroperitoneal gas (series 2/ image 79), moderate periportal gas, and portal venous gas.  Liver is otherwise unremarkable. Spleen, pancreas, and adrenal glands are grossly unremarkable.  Status post cholecystectomy.  Bilateral renal atrophy with scattered probable tiny renal cysts. No hydronephrosis.  Multiple mildly prominent loops of small and large bowel, favored to reflect secondary adynamic ileus.  Multiple small midline/paramidline ventral hernias.  No abdominopelvic ascites.  No suspicious abdominopelvic lymphadenopathy.  Uterus and bilateral ovaries are unremarkable.  Bladder is decompressed with indwelling Foley catheter.  Degenerative changes of the lumbar spine, most prominent at L1-2.  IMPRESSION: Suspected discontinuity/frank perforation involving the posterior aspect of the gastric fundus. Associated gas and layering debris within a 19.9 cm extraluminal collection in the left mid abdomen.  Associated pneumoperitoneum, retroperitoneum, and portal venous gas. Associated pneumomediastinum, possible trace pneumopericardium, and possible tiny right anterior pneumothorax. Extensive subcutaneous emphysema involving the bilateral neck, bilateral chest, and right back.  Multiple mildly prominent loops of small and large bowel, favored to reflect secondary adynamic ileus.  Postsurgical changes at the GE junction related to Nissen fundoplication. Fluid within the distal esophagus.  Additional ancillary findings as above.   Critical value/emergent results were called by telephone at the time of interpretation on 04/20/2013 at 12:51 AM to Dr. Zadie Rhine, who verbally acknowledged these results. Findings were also discussed with Dr. Michaell Cowing on 04/25/2013 at 1248 hr.   Electronically Signed   By: Charline Bills M.D.   On: 04/15/2013 01:08   Dg Chest Port 1 View  05/19/13   CLINICAL DATA:  Abdominal pain, nausea/ vomiting, lethargy  EXAM: PORTABLE CHEST - 1 VIEW  COMPARISON:  None.  FINDINGS: Low lung volumes.  No focal consolidation.  Eventration of the right hemidiaphragm.  Mild cardiomegaly. Postsurgical changes related to prior CABG.  Possible lucency beneath the right liver, free air not excluded. Correlate with pending CT abdomen/pelvis.  Suspected subcutaneous emphysema in the neck, right greater than left.  IMPRESSION: Low lung volumes.  No evidence of acute cardiopulmonary disease.  Possible lucency beneath the right liver, free air not excluded. Correlate with pending CT abdomen/pelvis.  Suspected subcutaneous emphysema in the neck, right greater than left.  Critical value/emergent results were called by telephone at the time of interpretation on 19-May-2013 at 11:31 PM to Dr. Bebe Shaggy, who verbally acknowledged these results.   Electronically Signed   By: Charline Bills M.D.   On: 05/19/2013 23:32   Dg Abd Portable 2v  05-19-2013   CLINICAL DATA:  Abdominal pain, nausea/vomiting, lethargy  EXAM: PORTABLE ABDOMEN - 2 VIEW  COMPARISON:  CT abdomen pelvis dated 04/08/2013  FINDINGS: Lucency beneath the right liver is worrisome for pneumoperitoneum. Suspected Rigler's sign in the right mid abdomen.  Moderate gaseous distention with debris.  Surgical clips in the left upper abdomen.  Diffuse gaseous distention of small and large bowel.  IMPRESSION: Findings suspicious for pneumoperitoneum. Correlate with pending CT abdomen pelvis.  Critical value/emergent results were called by telephone at the  time of interpretation on  03/30/2013 at 11:32 PM to Dr. Bebe Shaggy, who verbally acknowledged these results.   Electronically Signed   By: Charline Bills M.D.   On: 04/04/2013 23:34     EKG Interpretation   Date/Time:  Friday April 25 2013 22:09:21 EST Ventricular Rate:  80 PR Interval:  176 QRS Duration: 98 QT Interval:  424 QTC Calculation: 489 R Axis:   -19 Text Interpretation:  Sinus rhythm with Premature atrial complexes  Nonspecific ST abnormality Abnormal ECG When compared with ECG of  12-Mar-2012 11:53, Premature atrial complexes are now Present Nonspecific  T wave abnormality no longer evident in Inferior leads Nonspecific T wave  abnormality no longer evident in Anterior leads Confirmed by Adriana Simas  MD,  BRIAN ([id number]) on 04/02/2013 10:57:50 PM      Angiocath insertion Performed by: Dierdre Forth  Consent: Verbal consent obtained. Risks and benefits: risks, benefits and alternatives were discussed Time out: Immediately prior to procedure a "time out" was called to verify the correct patient, procedure, equipment, support staff and site/side marked as required.  Preparation: Patient was prepped and draped in the usual sterile fashion.  Vein Location: Left forearm   Gauge: 22ga  Normal blood return and flush without difficulty Patient tolerance: Patient tolerated the procedure well with no immediate complications.     CRITICAL CARE Performed by: Dierdre Forth Total critical care time: Critical care time was exclusive of separately billable procedures and treating other patients. Critical care was necessary to treat or prevent imminent or life-threatening deterioration. Critical care was time spent personally by me on the following activities: development of treatment plan with patient and/or surrogate as well as nursing, discussions with consultants, evaluation of patient's response to treatment, examination of patient, obtaining history from patient or surrogate,  ordering and performing treatments and interventions, ordering and review of laboratory studies, ordering and review of radiographic studies, pulse oximetry and re-evaluation of patient's condition.   MDM   Final diagnoses:  Pneumoperitoneum  Mediastinal emphysema (pneumomediastinum)    [name] presents with abdominal distention, rigidity and sudden onset of epigastric pain tonight after dinner.  Significant concern for possible bowel perforation. Labs and imaging pending.  11:30 PM Chest x-ray with free air in the diaphragm and portable abdominal series consistent with pneumoperitoneum.  Patient continues to deteriorate. Now with subcutaneous emphysema around the neck. Concern now for possible esophageal rupture in addition to others.  CT scan pending.  12:15PM Patient now with hypoxia 88%. Placed on nonrebreather. Extremities are now cold without palpable pedal pulses and capillary refill greater than 3 seconds.   CT with likely frank perforation of the posterior aspect of the gastric fundus. There is associated pneumoperitoneum retroperitoneum and portal venous gas and pneumomediastinum.    12:45AM Patient intubated by Dr. Bebe Shaggy. We'll plan for transfer to [facility] long for emergent surgery.  1:11 AM Pt to be transferred to [facility] ED.  Dr. Michaell Cowing will meet her there for definitive OR management.    The patient was discussed with and seen by Dr. Bebe Shaggy.    Haley Client Toney Lizaola, PA-C 21-May-2013 [phone]

## 2013-04-25 NOTE — ED Notes (Signed)
Pt to department via EMS.  Per report, pt experienced sharp right epigastric pain after cleaning up from dinner.  Pt also reporting some nausea, no vomiting.

## 2013-04-26 ENCOUNTER — Emergency Department (HOSPITAL_COMMUNITY): Payer: Medicare PPO | Admitting: Anesthesiology

## 2013-04-26 ENCOUNTER — Encounter (HOSPITAL_COMMUNITY): Payer: Medicare PPO | Admitting: Anesthesiology

## 2013-04-26 ENCOUNTER — Emergency Department (HOSPITAL_COMMUNITY): Payer: Medicare PPO

## 2013-04-26 ENCOUNTER — Inpatient Hospital Stay (HOSPITAL_COMMUNITY): Payer: Medicare PPO

## 2013-04-26 ENCOUNTER — Encounter (HOSPITAL_COMMUNITY): Payer: Self-pay | Admitting: Surgery

## 2013-04-26 ENCOUNTER — Encounter (HOSPITAL_COMMUNITY): Admission: EM | Disposition: E | Payer: Self-pay | Source: Home / Self Care

## 2013-04-26 ENCOUNTER — Encounter (HOSPITAL_COMMUNITY): Payer: Self-pay | Admitting: Anesthesiology

## 2013-04-26 DIAGNOSIS — K3189 Other diseases of stomach and duodenum: Secondary | ICD-10-CM | POA: Diagnosis present

## 2013-04-26 DIAGNOSIS — K319 Disease of stomach and duodenum, unspecified: Secondary | ICD-10-CM

## 2013-04-26 DIAGNOSIS — J982 Interstitial emphysema: Secondary | ICD-10-CM | POA: Diagnosis present

## 2013-04-26 DIAGNOSIS — K668 Other specified disorders of peritoneum: Secondary | ICD-10-CM

## 2013-04-26 HISTORY — PX: LAPAROTOMY: SHX154

## 2013-04-26 LAB — BLOOD GAS, ARTERIAL
ACID-BASE DEFICIT: 15.1 mmol/L — AB (ref 0.0–2.0)
Acid-base deficit: 8 mmol/L — ABNORMAL HIGH (ref 0.0–2.0)
Acid-base deficit: 12.9 mmol/L — ABNORMAL HIGH (ref 0.0–2.0)
BICARBONATE: 21.5 meq/L (ref 20.0–24.0)
BICARBONATE: 12.7 meq/L — AB (ref 20.0–24.0)
Bicarbonate: 13.1 mEq/L — ABNORMAL LOW (ref 20.0–24.0)
Drawn by: 235321
Drawn by: 317871
FIO2: 1 %
FIO2: 1 %
FIO2: 1 %
MECHVT: 500 mL
MECHVT: 500 mL
MECHVT: 500 mL
O2 SAT: 91 %
O2 SAT: 85 %
O2 Saturation: 28.3 %
PATIENT TEMPERATURE: 98.6
PATIENT TEMPERATURE: 98.6
PCO2 ART: 63.1 mmHg — AB (ref 35.0–45.0)
PEEP/CPAP: 5 cmH2O
PEEP/CPAP: 5 cmH2O
PEEP: 5 cmH2O
PH ART: 7.151 — AB (ref 7.350–7.450)
PO2 ART: 0 mmHg — AB (ref 80.0–100.0)
PO2 ART: 69.9 mmHg — AB (ref 80.0–100.0)
Patient temperature: 96.7
RATE: 16 resp/min
RATE: 22 resp/min
RATE: 35 resp/min
TCO2: 21.3 mmol/L (ref 0–100)
TCO2: 13.2 mmol/L (ref 0–100)
TCO2: 13.4 mmol/L (ref 0–100)
pCO2 arterial: 41.7 mmHg (ref 35.0–45.0)
pCO2 arterial: 32.1 mmHg — ABNORMAL LOW (ref 35.0–45.0)
pH, Arterial: 7.11 — CL (ref 7.350–7.450)
pH, Arterial: 7.234 — ABNORMAL LOW (ref 7.350–7.450)
pO2, Arterial: 49.7 mmHg — ABNORMAL LOW (ref 80.0–100.0)

## 2013-04-26 LAB — GLUCOSE, CAPILLARY
Glucose-Capillary: 239 mg/dL — ABNORMAL HIGH (ref 70–99)
Glucose-Capillary: 241 mg/dL — ABNORMAL HIGH (ref 70–99)

## 2013-04-26 LAB — MRSA PCR SCREENING: MRSA by PCR: NEGATIVE

## 2013-04-26 SURGERY — LAPAROTOMY, EXPLORATORY
Anesthesia: General | Site: Abdomen

## 2013-04-26 MED ORDER — LIP MEDEX EX OINT
1.0000 "application " | TOPICAL_OINTMENT | Freq: Two times a day (BID) | CUTANEOUS | Status: DC
  Filled 2013-04-26: qty 7

## 2013-04-26 MED ORDER — EPINEPHRINE HCL 0.1 MG/ML IJ SOSY
PREFILLED_SYRINGE | INTRAMUSCULAR | Status: DC | PRN
  Administered 2013-04-26 (×2): 10 ug via INTRAVENOUS
  Administered 2013-04-26: 20 ug via INTRAVENOUS
  Administered 2013-04-26 (×4): 10 ug via INTRAVENOUS
  Administered 2013-04-26 (×2): 20 ug via INTRAVENOUS
  Administered 2013-04-26 (×3): 10 ug via INTRAVENOUS

## 2013-04-26 MED ORDER — BISACODYL 10 MG RE SUPP: 10.0000 mg | Freq: Two times a day (BID) | RECTAL | Status: DC | PRN

## 2013-04-26 MED ORDER — IOHEXOL 300 MG/ML  SOLN
50.0000 mL | Freq: Once | INTRAMUSCULAR | Status: AC | PRN
  Administered 2013-04-26: 50 mL via ORAL

## 2013-04-26 MED ORDER — PHENYLEPHRINE HCL 10 MG/ML IJ SOLN
10.0000 mg | INTRAVENOUS | Status: DC | PRN
  Administered 2013-04-26: 10 ug/min via INTRAVENOUS

## 2013-04-26 MED ORDER — ONDANSETRON 8 MG/NS 50 ML IVPB
8.0000 mg | Freq: Four times a day (QID) | INTRAVENOUS | Status: DC | PRN
  Filled 2013-04-26: qty 8

## 2013-04-26 MED ORDER — PHENYLEPHRINE HCL 10 MG/ML IJ SOLN
30.0000 ug/min | INTRAVENOUS | Status: DC
  Administered 2013-04-26: 200 ug/min via INTRAVENOUS
  Filled 2013-04-26: qty 2

## 2013-04-26 MED ORDER — CHLORHEXIDINE GLUCONATE 4 % EX LIQD
1.0000 "application " | Freq: Once | CUTANEOUS | Status: DC
  Filled 2013-04-26: qty 15

## 2013-04-26 MED ORDER — PANTOPRAZOLE SODIUM 40 MG IV SOLR
40.0000 mg | Freq: Every day | INTRAVENOUS | Status: DC
  Filled 2013-04-26: qty 40

## 2013-04-26 MED ORDER — INSULIN ASPART 100 UNIT/ML ~~LOC~~ SOLN: 1.0000 [IU] | SUBCUTANEOUS | Status: DC

## 2013-04-26 MED ORDER — FENTANYL CITRATE 0.05 MG/ML IJ SOLN: 25.0000 ug | INTRAMUSCULAR | Status: DC | PRN

## 2013-04-26 MED ORDER — CHLORHEXIDINE GLUCONATE 0.12 % MT SOLN
15.0000 mL | Freq: Two times a day (BID) | OROMUCOSAL | Status: DC
  Filled 2013-04-26 (×2): qty 15

## 2013-04-26 MED ORDER — ONDANSETRON HCL 4 MG/2ML IJ SOLN: 4.0000 mg | Freq: Four times a day (QID) | INTRAMUSCULAR | Status: DC | PRN

## 2013-04-26 MED ORDER — SODIUM CHLORIDE 0.9 % IV SOLN
1000.0000 mL | Freq: Once | INTRAVENOUS | Status: AC
  Administered 2013-04-26: 1000 mL via INTRAVENOUS

## 2013-04-26 MED ORDER — LACTATED RINGERS IV SOLN
INTRAVENOUS | Status: DC | PRN
Start: 2013-04-26 — End: 2013-04-26
  Administered 2013-04-26 (×2): via INTRAVENOUS

## 2013-04-26 MED ORDER — BIOTENE DRY MOUTH MT LIQD: 15.0000 mL | Freq: Four times a day (QID) | OROMUCOSAL | Status: DC

## 2013-04-26 MED ORDER — NOREPINEPHRINE BITARTRATE 1 MG/ML IJ SOLN
4000.0000 ug | INTRAVENOUS | Status: DC | PRN
  Administered 2013-04-26: 2.67 ug/min via INTRAVENOUS

## 2013-04-26 MED ORDER — LACTATED RINGERS IV SOLN: INTRAVENOUS | Status: DC

## 2013-04-26 MED ORDER — HYDROCORTISONE NA SUCCINATE PF 100 MG IJ SOLR: 100.0000 mg | Freq: Three times a day (TID) | INTRAMUSCULAR | Status: DC

## 2013-04-26 MED ORDER — LACTATED RINGERS IV SOLN
INTRAVENOUS | Status: DC | PRN
  Administered 2013-04-26: 04:00:00 via INTRAVENOUS

## 2013-04-26 MED ORDER — EPHEDRINE SULFATE 50 MG/ML IJ SOLN
INTRAMUSCULAR | Status: DC | PRN
  Administered 2013-04-26: 10 mg via INTRAVENOUS

## 2013-04-26 MED ORDER — DEXTROSE 5 % IV SOLN
2.0000 ug/min | INTRAVENOUS | Status: DC
  Administered 2013-04-26: 50 ug/min via INTRAVENOUS
  Filled 2013-04-26: qty 8

## 2013-04-26 MED ORDER — MENTHOL 3 MG MT LOZG: 1.0000 | LOZENGE | OROMUCOSAL | Status: DC | PRN

## 2013-04-26 MED ORDER — PHENOL 1.4 % MT LIQD: 2.0000 | OROMUCOSAL | Status: DC | PRN

## 2013-04-26 MED ORDER — ACETAMINOPHEN 650 MG RE SUPP
650.0000 mg | Freq: Four times a day (QID) | RECTAL | Status: DC | PRN
Start: 2013-04-26 — End: 2013-04-26

## 2013-04-26 MED ORDER — 0.9 % SODIUM CHLORIDE (POUR BTL) OPTIME
TOPICAL | Status: DC | PRN
  Administered 2013-04-26: 13000 mL

## 2013-04-26 MED ORDER — SODIUM CHLORIDE 0.9 % IV SOLN: 1000.0000 mL | Freq: Once | INTRAVENOUS | Status: DC

## 2013-04-26 MED ORDER — SODIUM CHLORIDE 0.9 % IV SOLN
0.0300 [IU]/min | INTRAVENOUS | Status: DC
  Administered 2013-04-26: 0.03 [IU]/min via INTRAVENOUS
  Filled 2013-04-26: qty 2.5

## 2013-04-26 MED ORDER — NOREPINEPHRINE BITARTRATE 1 MG/ML IJ SOLN
2.0000 ug/min | INTRAMUSCULAR | Status: DC
  Filled 2013-04-26: qty 4

## 2013-04-26 MED ORDER — SODIUM CHLORIDE 0.9 % IV SOLN
0.0000 ug/h | INTRAVENOUS | Status: DC
  Administered 2013-04-26: 50 ug/h via INTRAVENOUS
  Filled 2013-04-26: qty 50

## 2013-04-26 MED ORDER — PROMETHAZINE HCL 25 MG/ML IJ SOLN: 6.2500 mg | Freq: Four times a day (QID) | INTRAMUSCULAR | Status: DC | PRN

## 2013-04-26 MED ORDER — PHENYLEPHRINE HCL 10 MG/ML IJ SOLN
30.0000 ug/min | INTRAVENOUS | Status: DC
  Administered 2013-04-26: 150 ug/min via INTRAVENOUS
  Administered 2013-04-26: 30 ug/min via INTRAVENOUS
  Filled 2013-04-26 (×2): qty 1

## 2013-04-26 MED ORDER — SODIUM CHLORIDE 0.9 % IV SOLN
INTRAVENOUS | Status: DC | PRN
  Administered 2013-04-26: 03:00:00 via INTRAVENOUS

## 2013-04-26 MED ORDER — PIPERACILLIN-TAZOBACTAM 3.375 G IVPB
3.3750 g | Freq: Three times a day (TID) | INTRAVENOUS | Status: DC
  Administered 2013-04-26: 3.375 g via INTRAVENOUS
  Filled 2013-04-26 (×3): qty 50

## 2013-04-26 MED ORDER — SODIUM BICARBONATE 8.4 % IV SOLN
INTRAVENOUS | Status: AC
  Filled 2013-04-26: qty 100

## 2013-04-26 MED ORDER — FENTANYL BOLUS VIA INFUSION
25.0000 ug | INTRAVENOUS | Status: DC | PRN
  Filled 2013-04-26: qty 50

## 2013-04-26 MED ORDER — SODIUM BICARBONATE 8.4 % IV SOLN
100.0000 meq | Freq: Once | INTRAVENOUS | Status: AC
  Administered 2013-04-26: 100 meq via INTRAVENOUS

## 2013-04-26 MED ORDER — MIDAZOLAM HCL 2 MG/2ML IJ SOLN: 1.0000 mg | INTRAMUSCULAR | Status: DC | PRN

## 2013-04-26 MED ORDER — ALUM & MAG HYDROXIDE-SIMETH 200-200-20 MG/5ML PO SUSP: 30.0000 mL | Freq: Four times a day (QID) | ORAL | Status: DC | PRN

## 2013-04-26 MED ORDER — FLUCONAZOLE IN SODIUM CHLORIDE 200-0.9 MG/100ML-% IV SOLN
200.0000 mg | INTRAVENOUS | Status: DC
  Filled 2013-04-26: qty 100

## 2013-04-26 MED ORDER — MAGIC MOUTHWASH
15.0000 mL | Freq: Four times a day (QID) | ORAL | Status: DC | PRN
  Filled 2013-04-26: qty 15

## 2013-04-26 MED ORDER — SODIUM CHLORIDE 0.9 % IV SOLN: 1000.0000 mL | INTRAVENOUS | Status: DC

## 2013-04-26 MED ORDER — SODIUM CHLORIDE 0.9 % IV BOLUS (SEPSIS)
1000.0000 mL | Freq: Once | INTRAVENOUS | Status: AC
  Administered 2013-04-26: 1000 mL via INTRAVENOUS

## 2013-04-26 MED ORDER — IOHEXOL 300 MG/ML  SOLN
100.0000 mL | Freq: Once | INTRAMUSCULAR | Status: AC | PRN
  Administered 2013-04-26: 100 mL via INTRAVENOUS

## 2013-04-26 MED ORDER — ROCURONIUM BROMIDE 100 MG/10ML IV SOLN
INTRAVENOUS | Status: DC | PRN
  Administered 2013-04-26: 40 mg via INTRAVENOUS

## 2013-04-26 MED ORDER — NOREPINEPHRINE BITARTRATE 1 MG/ML IJ SOLN
2.0000 ug/min | INTRAVENOUS | Status: DC
  Filled 2013-04-26: qty 16

## 2013-04-26 MED ORDER — DIPHENHYDRAMINE HCL 50 MG/ML IJ SOLN: 12.5000 mg | Freq: Four times a day (QID) | INTRAMUSCULAR | Status: DC | PRN

## 2013-04-26 MED ORDER — LACTATED RINGERS IV BOLUS (SEPSIS): 1000.0000 mL | Freq: Three times a day (TID) | INTRAVENOUS | Status: DC | PRN

## 2013-04-26 MED ORDER — NOREPINEPHRINE BITARTRATE 1 MG/ML IJ SOLN
2.0000 ug/min | INTRAVENOUS | Status: DC
  Administered 2013-04-26: 40 ug/min via INTRAVENOUS
  Filled 2013-04-26: qty 4

## 2013-04-26 MED ORDER — FENTANYL CITRATE 0.05 MG/ML IJ SOLN: 50.0000 ug | Freq: Once | INTRAMUSCULAR | Status: DC

## 2013-04-26 MED ORDER — LACTATED RINGERS IV BOLUS (SEPSIS)
1000.0000 mL | INTRAVENOUS | Status: AC
  Administered 2013-04-26: 1000 mL via INTRAVENOUS

## 2013-04-26 MED ORDER — PROPOFOL 10 MG/ML IV EMUL
INTRAVENOUS | Status: AC
  Filled 2013-04-26: qty 100

## 2013-04-26 MED ORDER — PANTOPRAZOLE SODIUM 40 MG IV SOLR: 40.0000 mg | Freq: Two times a day (BID) | INTRAVENOUS | Status: DC

## 2013-04-26 SURGICAL SUPPLY — 52 items
BLADE EXTENDED COATED 6.5IN (ELECTRODE) IMPLANT
BLADE HEX COATED 2.75 (ELECTRODE) ×4 IMPLANT
BLADE SURG SZ10 CARB STEEL (BLADE) ×3 IMPLANT
CANISTER SUCTION 2500CC (MISCELLANEOUS) ×3 IMPLANT
CATH KIT ON Q 7.5IN SLV (PAIN MANAGEMENT) IMPLANT
COUNTER NEEDLE 20 DBL MAG RED (NEEDLE) ×1 IMPLANT
COVER MAYO STAND STRL (DRAPES) ×4 IMPLANT
DRAIN CHANNEL 19F RND (DRAIN) IMPLANT
DRAPE LAPAROSCOPIC ABDOMINAL (DRAPES) ×3 IMPLANT
DRAPE LG THREE QUARTER DISP (DRAPES) ×1 IMPLANT
DRAPE UTILITY XL STRL (DRAPES) ×4 IMPLANT
DRAPE WARM FLUID 44X44 (DRAPE) ×3 IMPLANT
DRSG OPSITE POSTOP 4X10 (GAUZE/BANDAGES/DRESSINGS) IMPLANT
DRSG OPSITE POSTOP 4X6 (GAUZE/BANDAGES/DRESSINGS) IMPLANT
DRSG OPSITE POSTOP 4X8 (GAUZE/BANDAGES/DRESSINGS) IMPLANT
ELECT REM PT RETURN 9FT ADLT (ELECTROSURGICAL) ×3
ELECTRODE REM PT RTRN 9FT ADLT (ELECTROSURGICAL) ×1 IMPLANT
GLOVE ECLIPSE 8.0 STRL XLNG CF (GLOVE) ×6 IMPLANT
GLOVE INDICATOR 8.0 STRL GRN (GLOVE) ×6 IMPLANT
GOWN STRL REUS W/TWL XL LVL3 (GOWN DISPOSABLE) ×12 IMPLANT
KIT BASIN OR (CUSTOM PROCEDURE TRAY) ×3 IMPLANT
LEGGING LITHOTOMY PAIR STRL (DRAPES) ×1 IMPLANT
LIGASURE IMPACT 36 18CM CVD LR (INSTRUMENTS) IMPLANT
PACK GENERAL/GYN (CUSTOM PROCEDURE TRAY) ×3 IMPLANT
PENCIL BUTTON HOLSTER BLD 10FT (ELECTRODE) ×1 IMPLANT
RELOAD PROXIMATE 75MM BLUE (ENDOMECHANICALS) ×3 IMPLANT
SEALER TISSUE X1 CVD JAW (INSTRUMENTS) ×2 IMPLANT
SPONGE ABDOMINAL VAC ABTHERA (MISCELLANEOUS) ×3 IMPLANT
SPONGE GAUZE 4X4 12PLY (GAUZE/BANDAGES/DRESSINGS) ×3 IMPLANT
SPONGE LAP 18X18 X RAY DECT (DISPOSABLE) ×18 IMPLANT
STAPLER PROXIMATE 75MM BLUE (STAPLE) ×10 IMPLANT
STAPLER VISISTAT 35W (STAPLE) ×3 IMPLANT
SUCTION POOLE TIP (SUCTIONS) ×3 IMPLANT
SUT NOV 1 T60/GS (SUTURE) IMPLANT
SUT NOVA NAB DX-16 0-1 5-0 T12 (SUTURE) IMPLANT
SUT NOVA T20/GS 25 (SUTURE) IMPLANT
SUT PDS AB 1 CTX 36 (SUTURE) IMPLANT
SUT SILK 2 0 (SUTURE) ×3
SUT SILK 2 0SH CR/8 30 (SUTURE) ×1 IMPLANT
SUT SILK 2-0 18XBRD TIE 12 (SUTURE) ×1 IMPLANT
SUT SILK 3 0 (SUTURE)
SUT SILK 3 0 SH CR/8 (SUTURE) IMPLANT
SUT SILK 3-0 18XBRD TIE 12 (SUTURE) ×1 IMPLANT
SUT VIC AB 2-0 SH 18 (SUTURE) IMPLANT
SUT VIC AB 3-0 SH 18 (SUTURE) ×1 IMPLANT
SUT VICRYL 2 0 18  UND BR (SUTURE)
SUT VICRYL 2 0 18 UND BR (SUTURE) ×1 IMPLANT
TOWEL OR 17X26 10 PK STRL BLUE (TOWEL DISPOSABLE) ×6 IMPLANT
TOWEL OR NON WOVEN STRL DISP B (DISPOSABLE) ×6 IMPLANT
TRAY FOLEY CATH 14FRSI W/METER (CATHETERS) ×1 IMPLANT
TUNNELER SHEATH ON-Q 16GX12 DP (PAIN MANAGEMENT) IMPLANT
YANKAUER SUCT BULB TIP 10FT TU (MISCELLANEOUS) ×3 IMPLANT

## 2013-04-27 NOTE — Addendum Note (Signed)
Addendum created 04/07/2013 1636 by Gaetano Hawthorneharles L Annaleah Arata, MD   Modules edited: Clinical Notes   Clinical Notes:  File: [id number]

## 2013-04-27 NOTE — Transfer of Care (Signed)
Immediate Anesthesia Transfer of Care Note  Patient: Haley MorelAlyne D Allaire  Procedure(s) Performed: Procedure(s): EXPLORATORY LAPAROTOMY with wound vac application sleeve gastrectomy  (N/A)  Patient Location: ICU  Anesthesia Type:General  Level of Consciousness: sedated and unresponsive  Airway & Oxygen Therapy: Patient remains intubated per anesthesia plan and Patient placed on Ventilator (see vital sign flow sheet for setting)  Post-op Assessment: Report given to PACU RN and Post -op Vital signs reviewed and unstable, Anesthesiologist notified  Post vital signs: Reviewed and unstable  Complications: No apparent anesthesia complications

## 2013-04-27 NOTE — ED Notes (Signed)
Facial swelling noted predominately on right side of face. Puffiness noted supraclavicular right side.ERPA and ERMD notified immediately.

## 2013-04-27 NOTE — H&P (Signed)
Burton, MD, [address] [place], [place] [address] Phone: [phone] FAX: [phone]     [name]  Mar 12, 1923 [id number]  CARE TEAM:  PCP: Delphina Cahill, MD  Outpatient Care Team: Patient Care Team: Delphina Cahill, MD as PCP - General (Internal Medicine) Rogene Houston, MD as Consulting Physician (Gastroenterology) Sherren Mocha, MD as Consulting Physician (Cardiology)  Inpatient Treatment Team: Treatment Team: Attending Provider: Delora Fuel, MD; Respiratory Therapist: Lowella Curb, RRT  This patient is a 80 y.o.female who presents today for surgical evaluation at the request of Dr. Christy Gentles.   Reason for evaluation: Pneumoperitoneum  Elderly woman with prior Nissen fundoplication and exploratory laparotomy for delayed perforation in the past.  This followed by gastroenterology.  History of duodenal ulcer treated a few years ago.  History of ischemic colitis treated with antibiotics.  Known diverticulosis but no major diverticulitis.  Had abdominal pain.  Underwent upper endoscopy and CT scan within the past month that were rather underwhelming.  Develop abdominal pain after eating dinner.  It became unbearable.  Brought by ambulance to [facility] emergency room.  Surgical request made there.  They deferred to major center.  Family wished [facility], Especially knowing that I was on call since I have operated on another person in their family..  When I was called, the patient was not in shock.  Had some gas on plain films.  I recommended patient coming to [facility] per family request & could consult, Anticipating probable emergent operation and ICU were medicine involvement.  Within an hour, the patient has gone into Shock & was intubated.  CT scan confirmed massive pneumoperitoneum tracking up mediastinum and external chest wall.  I discussed with radiology.  Very suspicious for a  massive gastric perforation  Extended family at bedside concerned.  Dr. Roxanne Mins emergency physician and numerous [facility] emergency room nurses and staff at bedside.  Past Medical History  Diagnosis Date  . CAD (coronary artery disease)     a.  s/p MI;  b. s/p CABG x 4 - LIMA->LAD, VG->DIAG, VG->PLA, VG->PDA;  c. Cath 05/2011 - LM 50-60%,  severe LAD/RCA dzs,  LIMA atretic, VG's patent;  d. Myoview 05/2005 (done after cath) EF 72%, no ischemia, fixed ant thinning.  . Carotid artery disease   . Paroxysmal a-fib     a. no longer on coumadin 2/2 h/o falls.  Marland Kitchen HTN (hypertension)   . Hyperlipidemia   . Arthropathy, unspecified, other specified sites   . Sacroiliac joint dysfunction   . Spinal stenosis   . Anemia     iron deficiency  . Hypercalcemia   . Osteoarthrosis, unspecified whether generalized or localized, ankle and foot   . Tibialis tendinitis   . Benign paroxysmal positional vertigo   . Prerenal azotemia     mild  . Anemia, macrocytic   . Raynaud's syndrome   . Impingement syndrome of shoulder region     unspecified area  . Osteoporosis, unspecified   . Shoulder pain, bilateral   . Chronic low back pain   . Arthritis   . Constipation   . PUD (peptic ulcer disease)   . Depression   . Anxiety   . Allergic rhinitis   . Fatigue   . ISCHEMIC COLITIS 09/24/2006    Qualifier: Diagnosis of  By: Alcario Drought LPN, Sherrie    . Nausea with vomiting 01/13/2008    Qualifier:  Diagnosis of  By: Jonna Munro MD, Roderic Scarce    . Diverticulosis of colon 2008     Colonoscopy     Past Surgical History  Procedure Laterality Date  . Appendectomy    . Cholecystectomy    . Laparotomy      exploratory  . Coronary artery bypass graft      4 grafts - 1998  . Hiatal hernia repair  1998  . Cataract extraction    . Esophagogastroduodenoscopy N/A 03/27/2013    Procedure: ESOPHAGOGASTRODUODENOSCOPY (EGD);  Surgeon: Rogene Houston, MD;  Location: [facility] ENDO SUITE;  Service: Endoscopy;  Laterality: N/A;   255-moved to 200 Ann to notify pt    History   Social History  . Marital Status: Married    Spouse Name: N/A    Number of Children: N/A  . Years of Education: N/A   Occupational History  . Not on file.   Social History Main Topics  . Smoking status: Never Smoker   . Smokeless tobacco: Not on file  . Alcohol Use: No  . Drug Use: No  . Sexual Activity: Not on file   Other Topics Concern  . 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     No family history on file.  Current Facility-Administered Medications  Medication Dose Route Frequency Provider Last Rate Last Dose  . 0.9 %  sodium chloride infusion  1,000 mL Intravenous Once Delora Fuel, MD       Followed by  . 0.9 %  sodium chloride infusion  1,000 mL Intravenous Continuous Delora Fuel, MD      . chlorhexidine (HIBICLENS) 4 % liquid 1 application  1 application Topical Once Adin Hector, MD      . Derrill Memo ON 05/12/2013] chlorhexidine (HIBICLENS) 4 % liquid 1 application  1 application Topical Once Adin Hector, MD      . diphenhydrAMINE (BENADRYL) 50 MG/ML injection           . lidocaine (cardiac) 100 mg/23ml (XYLOCAINE) 20 MG/ML injection 2%           . piperacillin-tazobactam (ZOSYN) IVPB 3.375 g  3.375 g Intravenous 3 times per day Adin Hector, MD      . propofol (DIPRIVAN) 10 mg/ml infusion           . rocuronium (ZEMURON) 50 MG/5ML injection           . sodium chloride 0.9 % bolus 1,000 mL  1,000 mL Intravenous Once Sharyon Cable, MD 1,000 mL/hr at 05/23/2013 0222 1,000 mL at 23-May-2013 0222   Current Outpatient Prescriptions  Medication Sig Dispense Refill  . ALPRAZolam (XANAX) 1 MG tablet Take 1.5 mg by mouth daily.       Marland Kitchen aspirin EC 81 MG EC tablet Take 1 tablet (81 mg total) by mouth daily.      . Biotin 1000 MCG tablet Take 1,000 mcg by mouth 2 (two) times daily.       . citalopram (CELEXA) 20 MG tablet Take 20 mg by mouth daily.      .  DULoxetine (CYMBALTA) 30 MG capsule Take 30 mg by mouth daily.      Marland Kitchen ipratropium (ATROVENT) 0.06 % nasal spray Place 3 sprays into both nostrils daily.       Marland Kitchen levothyroxine (SYNTHROID, LEVOTHROID) 88 MCG tablet Take 88 mcg by mouth daily.        . metoprolol  succinate (TOPROL-XL) 25 MG 24 hr tablet Take 12.5 mg by mouth daily.      . nitroGLYCERIN (NITROSTAT) 0.4 MG SL tablet Place 1 tablet (0.4 mg total) under the tongue every 5 (five) minutes as needed for chest pain.  25 tablet  6  . NON FORMULARY Take 10,000 mcg by mouth daily. Hair, skin and nails vitamin      . omeprazole (PRILOSEC) 20 MG capsule Take 1 capsule (20 mg total) by mouth 2 (two) times daily.  60 capsule  1  . omeprazole (PRILOSEC) 20 MG capsule Take 1 capsule (20 mg total) by mouth daily.  30 capsule  5  . ramipril (ALTACE) 5 MG capsule TAKE ONE CAPSULE DAILY.  90 capsule  0  . senna (SENOKOT) 8.6 MG tablet Take 1 tablet by mouth daily as needed for constipation.          Allergies  Allergen Reactions  . Codeine Nausea And Vomiting  . Tramadol Nausea And Vomiting  . Ultracet [Tramadol-Acetaminophen]     ROS: Constitutional:  No fevers, chills, sweats.  Weight stable Eyes:  No vision changes, No discharge HENT:  No sore throats, nasal drainage Lymph: No neck swelling, No bruising easily Pulmonary:  No cough, productive sputum CV: No orthopnea, PND  Patient walks 10 minutes for about 1/4 miles without difficulty.  No exertional chest/neck/shoulder/arm pain. GI:  No personal nor family history of GI/colon cancer, inflammatory bowel disease, irritable bowel syndrome, allergy such as Celiac Sprue, dietary/dairy problems.  No recent sick contacts/gastroenteritis.  No travel outside the country.  No changes in diet. Renal: No UTIs, No hematuria Genital:  No drainage, bleeding, masses Musculoskeletal: No severe joint pain.  Good ROM major joints Skin:  No sores or lesions.  No rashes Heme/Lymph:  No easy bleeding.  No  swollen lymph nodes Neuro: No focal weakness/numbness.  No seizures Psych: No suicidal ideation.  No hallucinations  BP 87/51  Pulse 67  Temp(Src) 96.7 F (35.9 C) (Rectal)  Resp 19  Ht $R'5\' 7"'Yb$  (1.702 m)  Wt 135 lb (61.236 kg)  BMI 21.14 kg/m2  SpO2 95%  Physical Exam: General: Pt intubated in obvious distress. Turning head side to side Eyes: PERRL, normal EOM. Sclera nonicteric Neuro: CN II-XII intact w/o focal sensory/motor deficits. Lymph: No head/neck/groin lymphadenopathy Psych:  Confused.  Localizes to pain.  No following commands HENT: Normocephalic, Mucus membranes moist.  ETT in place.  No thrush Neck: Supple, No tracheal deviation.  Some crepitus Chest: No pain.  Fair respiratory excursion.  Crepitus right chest wall up to neck CV:  Pulses nonpalpable.  Irregular rhythm Abdomen: Very distended & firm.  Diffuse TTP (winces).  Small incarcerated midline incisional hernia. GU: NEFG.  Foley w clear urine.  No vaginal bleeding Ext:  SCDs BLE.  1+ edema.  Mottled BLE w cyanosis Skin: No petechiae / purpurea.  No major sores Musculoskeletal: No deformity.  OK ROM major joints   Results:   Labs: Results for orders placed during the hospital encounter of 04/07/2013 (from the past 48 hour(s))  CBC WITH DIFFERENTIAL     Status: Abnormal   Collection Time    04/09/2013 10:25 PM      Result Value Ref Range   WBC 14.2 (*) 4.0 - 10.5 K/uL   RBC 4.21  3.87 - 5.11 MIL/uL   Hemoglobin 13.1  12.0 - 15.0 g/dL   HCT 41.0  36.0 - 46.0 %   MCV 97.4  78.0 - 100.0 fL  MCH 31.1  26.0 - 34.0 pg   MCHC 32.0  30.0 - 36.0 g/dL   RDW 59.5 (*) 63.8 - 75.6 %   Platelets 289  150 - 400 K/uL   Neutrophils Relative % 83 (*) 43 - 77 %   Neutro Abs 11.7 (*) 1.7 - 7.7 K/uL   Lymphocytes Relative 10 (*) 12 - 46 %   Lymphs Abs 1.5  0.7 - 4.0 K/uL   Monocytes Relative 5  3 - 12 %   Monocytes Absolute 0.7  0.1 - 1.0 K/uL   Eosinophils Relative 2  0 - 5 %   Eosinophils Absolute 0.2  0.0 - 0.7 K/uL    Basophils Relative 0  0 - 1 %   Basophils Absolute 0.1  0.0 - 0.1 K/uL  COMPREHENSIVE METABOLIC PANEL     Status: Abnormal   Collection Time    04/07/2013 10:25 PM      Result Value Ref Range   Sodium 136 (*) 137 - 147 mEq/L   Potassium 4.7  3.7 - 5.3 mEq/L   Chloride 94 (*) 96 - 112 mEq/L   CO2 26  19 - 32 mEq/L   Glucose, Bld 202 (*) 70 - 99 mg/dL   BUN 26 (*) 6 - 23 mg/dL   Creatinine, Ser 4.33 (*) 0.50 - 1.10 mg/dL   Calcium 9.4  8.4 - 29.5 mg/dL   Total Protein 8.6 (*) 6.0 - 8.3 g/dL   Albumin 4.2  3.5 - 5.2 g/dL   AST 88 (*) 0 - 37 U/L   ALT 33  0 - 35 U/L   Alkaline Phosphatase 100  39 - 117 U/L   Total Bilirubin 0.6  0.3 - 1.2 mg/dL   GFR calc non Af Amer 39 (*) >90 mL/min   GFR calc Af Amer 45 (*) >90 mL/min   Comment: (NOTE)     The eGFR has been calculated using the CKD EPI equation.     This calculation has not been validated in all clinical situations.     eGFR's persistently <90 mL/min signify possible Chronic Kidney     Disease.  LIPASE, BLOOD     Status: Abnormal   Collection Time    04/24/2013 10:25 PM      Result Value Ref Range   Lipase >3000 (*) 11 - 59 U/L  TROPONIN I     Status: None   Collection Time    04/07/2013 10:25 PM      Result Value Ref Range   Troponin I <0.30  <0.30 ng/mL   Comment:            Due to the release kinetics of cTnI,     a negative result within the first hours     of the onset of symptoms does not rule out     myocardial infarction with certainty.     If myocardial infarction is still suspected,     repeat the test at appropriate intervals.  LACTIC ACID, PLASMA     Status: Abnormal   Collection Time    04/25/13 10:56 PM      Result Value Ref Range   Lactic Acid, Venous 3.8 (*) 0.5 - 2.2 mmol/L    Imaging / Studies: Ct Chest W Contrast  2013/05/20   CLINICAL DATA:  Shaft epigastric pain, nausea. Prior cholecystectomy, appendectomy, hiatal hernia repair, and Roux-en-Y.  EXAM: CT CHEST, ABDOMEN, AND PELVIS WITH CONTRAST   TECHNIQUE: Multidetector CT imaging of the chest, abdomen  and pelvis was performed following the standard protocol during bolus administration of intravenous contrast.  CONTRAST:  31mL OMNIPAQUE IOHEXOL 300 MG/ML SOLN, 169mL OMNIPAQUE IOHEXOL 300 MG/ML SOLN  COMPARISON:  Chest/abdominal radiographs dated 04/14/2013. CT abdomen pelvis dated 04/08/2013.  FINDINGS: CT CHEST FINDINGS  Extensive subcutaneous emphysema in the bilateral neck, bilateral chest wall, and right mid/lower back.  Moderate pneumomediastinum and possible trace pneumopericardium (series 3/ image 34).  Possible tiny right anterior pneumothorax (series 3/ image 28).  Mild patchy opacities in the bilateral lower lobes, likely atelectasis. Trace bilateral pleural effusions, left greater than right.  The heart is normal in size. Coronary atherosclerosis. Postsurgical changes related to prior CABG. Atherosclerotic calcifications of the aortic arch.  Fluid/debris within the lower esophagus.  Visualized osseous structures are within normal limits.  CT ABDOMEN AND PELVIS FINDINGS  Postsurgical changes at the GE junction related to Nissen fundoplication.  Suspected discontinuity/frank perforation involving the posterior aspect of the gastric fundus (series 3/image 53; series 4/image 54). Associated gas and layering fluid/debris within an 18.8 x 11.8 x 19.9 cm extraluminal collection in the left mid abdomen (series 3/ image 67). Associated large volume mesenteric gas within the right mid abdomen (series 3/ image 61), moderate bilateral retroperitoneal gas (series 2/ image 79), moderate periportal gas, and portal venous gas.  Liver is otherwise unremarkable. Spleen, pancreas, and adrenal glands are grossly unremarkable.  Status post cholecystectomy.  Bilateral renal atrophy with scattered probable tiny renal cysts. No hydronephrosis.  Multiple mildly prominent loops of small and large bowel, favored to reflect secondary adynamic ileus.  Multiple small  midline/paramidline ventral hernias.  No abdominopelvic ascites.  No suspicious abdominopelvic lymphadenopathy.  Uterus and bilateral ovaries are unremarkable.  Bladder is decompressed with indwelling Foley catheter.  Degenerative changes of the lumbar spine, most prominent at L1-2.  IMPRESSION: Suspected discontinuity/frank perforation involving the posterior aspect of the gastric fundus. Associated gas and layering debris within a 19.9 cm extraluminal collection in the left mid abdomen.  Associated pneumoperitoneum, retroperitoneum, and portal venous gas. Associated pneumomediastinum, possible trace pneumopericardium, and possible tiny right anterior pneumothorax. Extensive subcutaneous emphysema involving the bilateral neck, bilateral chest, and right back.  Multiple mildly prominent loops of small and large bowel, favored to reflect secondary adynamic ileus.  Postsurgical changes at the GE junction related to Nissen fundoplication. Fluid within the distal esophagus.  Additional ancillary findings as above.  Critical value/emergent results were called by telephone at the time of interpretation on 05/04/2013 at 12:51 AM to Dr. Ripley Fraise, who verbally acknowledged these results. Findings were also discussed with Dr. Johney Maine on 05/24/2013 at 1248 hr.   Electronically Signed   By: Julian Hy M.D.   On: Apr 27, 2013 01:08   Ct Abdomen Pelvis W Contrast  05/19/2013   CLINICAL DATA:  Shaft epigastric pain, nausea. Prior cholecystectomy, appendectomy, hiatal hernia repair, and Roux-en-Y.  EXAM: CT CHEST, ABDOMEN, AND PELVIS WITH CONTRAST  TECHNIQUE: Multidetector CT imaging of the chest, abdomen and pelvis was performed following the standard protocol during bolus administration of intravenous contrast.  CONTRAST:  30mL OMNIPAQUE IOHEXOL 300 MG/ML SOLN, 166mL OMNIPAQUE IOHEXOL 300 MG/ML SOLN  COMPARISON:  Chest/abdominal radiographs dated 04/20/2013. CT abdomen pelvis dated 04/08/2013.  FINDINGS: CT CHEST  FINDINGS  Extensive subcutaneous emphysema in the bilateral neck, bilateral chest wall, and right mid/lower back.  Moderate pneumomediastinum and possible trace pneumopericardium (series 3/ image 34).  Possible tiny right anterior pneumothorax (series 3/ image 28).  Mild patchy opacities in the bilateral lower lobes,  likely atelectasis. Trace bilateral pleural effusions, left greater than right.  The heart is normal in size. Coronary atherosclerosis. Postsurgical changes related to prior CABG. Atherosclerotic calcifications of the aortic arch.  Fluid/debris within the lower esophagus.  Visualized osseous structures are within normal limits.  CT ABDOMEN AND PELVIS FINDINGS  Postsurgical changes at the GE junction related to Nissen fundoplication.  Suspected discontinuity/frank perforation involving the posterior aspect of the gastric fundus (series 3/image 53; series 4/image 54). Associated gas and layering fluid/debris within an 18.8 x 11.8 x 19.9 cm extraluminal collection in the left mid abdomen (series 3/ image 67). Associated large volume mesenteric gas within the right mid abdomen (series 3/ image 61), moderate bilateral retroperitoneal gas (series 2/ image 79), moderate periportal gas, and portal venous gas.  Liver is otherwise unremarkable. Spleen, pancreas, and adrenal glands are grossly unremarkable.  Status post cholecystectomy.  Bilateral renal atrophy with scattered probable tiny renal cysts. No hydronephrosis.  Multiple mildly prominent loops of small and large bowel, favored to reflect secondary adynamic ileus.  Multiple small midline/paramidline ventral hernias.  No abdominopelvic ascites.  No suspicious abdominopelvic lymphadenopathy.  Uterus and bilateral ovaries are unremarkable.  Bladder is decompressed with indwelling Foley catheter.  Degenerative changes of the lumbar spine, most prominent at L1-2.  IMPRESSION: Suspected discontinuity/frank perforation involving the posterior aspect of the  gastric fundus. Associated gas and layering debris within a 19.9 cm extraluminal collection in the left mid abdomen.  Associated pneumoperitoneum, retroperitoneum, and portal venous gas. Associated pneumomediastinum, possible trace pneumopericardium, and possible tiny right anterior pneumothorax. Extensive subcutaneous emphysema involving the bilateral neck, bilateral chest, and right back.  Multiple mildly prominent loops of small and large bowel, favored to reflect secondary adynamic ileus.  Postsurgical changes at the GE junction related to Nissen fundoplication. Fluid within the distal esophagus.  Additional ancillary findings as above.  Critical value/emergent results were called by telephone at the time of interpretation on 05-14-13 at 12:51 AM to Dr. Ripley Fraise, who verbally acknowledged these results. Findings were also discussed with Dr. Johney Maine on 2013-05-14 at 1248 hr.   Electronically Signed   By: Julian Hy M.D.   On: May 14, 2013 01:08   Ct Abdomen Pelvis W Contrast  04/08/2013   CLINICAL DATA:  Abdominal pain, nausea beginning December, 2014.  EXAM: CT ABDOMEN AND PELVIS WITH CONTRAST  TECHNIQUE: Multidetector CT imaging of the abdomen and pelvis was performed using the standard protocol following bolus administration of intravenous contrast.  CONTRAST:  80 mL OMNIPAQUE IOHEXOL 300 MG/ML  SOLN  COMPARISON:  CT abdomen and pelvis 09/10/2006.  FINDINGS: There is cardiomegaly. Small pericardial calcification over the apex of the left ventricle is unchanged and compatible with some prior infectious or inflammatory process. Extensive atherosclerosis is noted. There is no pleural or pericardial effusion. The lung bases are clear.  The patient is status post cholecystectomy. Small low attenuating lesions in both kidneys are unchanged and compatible with cysts. The liver, spleen, adrenal glands, pancreas and biliary tree are unremarkable. Postoperative change of Nissen fundoplication is again  identified. Colonic diverticulosis appears worst in the sigmoid but there is no evidence of diverticulitis. The patient has a small ventral hernia containing a knuckle of colon but there is no bowel obstruction or other complicating feature. The small bowel appears normal. No lymphadenopathy or fluid is identified. Scattered aortoiliac atherosclerosis is noted but there is no aneurysm. No lytic or sclerotic bony lesion is identified. There is convex right scoliosis and multilevel degenerative change.  IMPRESSION:  No acute finding or finding to explain the patient's symptoms.  Status post Nissen fundoplication and cholecystectomy.  Diverticulosis without diverticulitis.  Small midline ventral hernia contains a knuckle of colon without obstruction other complicating feature.   Electronically Signed   By: Inge Rise M.D.   On: 04/08/2013 14:12   Dg Chest Portable 1 View  2013/05/08   CLINICAL DATA:  ETT placement  EXAM: PORTABLE CHEST - 1 VIEW  COMPARISON:  04/02/2013  FINDINGS: Endotracheal tube terminates 2.7 cm above the carina.  Low lung volumes. Possible tiny right pneumothorax on prior chest CT is not definitely evident on chest radiograph. Known pneumomediastinum is better visualized on CT.  Heart is top-normal in size. Postsurgical changes related to prior CABG.  Extensive subcutaneous emphysema in the bilateral neck a and chest wall, right greater than left. This is significantly increased from prior CT.  Prior suspected pneumoperitoneum is not well visualized on the study.  IMPRESSION: Endotracheal tube terminates 2.7 cm above the carina.  Additional findings are better visualized on CT.   Electronically Signed   By: Julian Hy M.D.   On: 05/08/2013 01:42   Dg Chest Port 1 View  04/10/2013   CLINICAL DATA:  Abdominal pain, nausea/ vomiting, lethargy  EXAM: PORTABLE CHEST - 1 VIEW  COMPARISON:  None.  FINDINGS: Low lung volumes.  No focal consolidation.  Eventration of the right  hemidiaphragm.  Mild cardiomegaly. Postsurgical changes related to prior CABG.  Possible lucency beneath the right liver, free air not excluded. Correlate with pending CT abdomen/pelvis.  Suspected subcutaneous emphysema in the neck, right greater than left.  IMPRESSION: Low lung volumes.  No evidence of acute cardiopulmonary disease.  Possible lucency beneath the right liver, free air not excluded. Correlate with pending CT abdomen/pelvis.  Suspected subcutaneous emphysema in the neck, right greater than left.  Critical value/emergent results were called by telephone at the time of interpretation on 03/31/2013 at 11:31 PM to Dr. Christy Gentles, who verbally acknowledged these results.   Electronically Signed   By: Julian Hy M.D.   On: 04/21/2013 23:32   Dg Abd Portable 2v  04/17/2013   CLINICAL DATA:  Abdominal pain, nausea/vomiting, lethargy  EXAM: PORTABLE ABDOMEN - 2 VIEW  COMPARISON:  CT abdomen pelvis dated 04/08/2013  FINDINGS: Lucency beneath the right liver is worrisome for pneumoperitoneum. Suspected Rigler's sign in the right mid abdomen.  Moderate gaseous distention with debris.  Surgical clips in the left upper abdomen.  Diffuse gaseous distention of small and large bowel.  IMPRESSION: Findings suspicious for pneumoperitoneum. Correlate with pending CT abdomen pelvis.  Critical value/emergent results were called by telephone at the time of interpretation on 04/07/2013 at 11:32 PM to Dr. Christy Gentles, who verbally acknowledged these results.   Electronically Signed   By: Julian Hy M.D.   On: 04/05/2013 23:34    Medications / Allergies: per chart  Antibiotics: Anti-infectives   Start     Dose/Rate Route Frequency Ordered Stop   May 08, 2013 0230  piperacillin-tazobactam (ZOSYN) IVPB 3.375 g     3.375 g 12.5 mL/hr over 240 Minutes Intravenous 3 times per day 05-08-13 0228     04/10/2013 2330  piperacillin-tazobactam (ZOSYN) IVPB 3.375 g  Status:  Discontinued     3.375 g 100 mL/hr over 30  Minutes Intravenous  Once 04/24/2013 2329 2013-05-08 0228      Assessment  [name]  80 y.o. female  Day of Surgery  Procedure(s): EXPLORATORY LAPAROTOMY with probable wound vac application  Problem List:  Active Problems:   Pneumoperitoneum of unknown etiology, probable gastric perforation   Mediastinal emphysema (pneumomediastinum)   Massive pneumoperitoneum, pneumomediastinum, subcutaneous emphysema.  Distended abdomen with shock.  Plan:  This is a grim situation.  Her risk of death without surgery I believe is 100%.  Her risk with surgery is not much better.  I discussed with the extended family at the bedside with the emergency room doctors and nurses present.   Because she has had a pretty good quality of life and independence, they wished to be reasonably aggressive and try surgery.  The anatomy & physiology of the digestive tract was discussed.  The pathophysiology of perforation was discussed.  Differential diagnosis such as perforated ulcer or colon, etc was discussed.   Natural history risks without surgery such as death was discussed.  I recommended abdominal exploration to diagnose & treat the source of the problem.  Laparoscopic & open techniques were discussed.   She is too distended and into much shock to tolerate laparoscopy.  Risks such as bleeding, infection, abscess, leak, reoperation, bowel resection, possible ostomy, hernia, heart attack, death, and other risks were discussed.   Possibility of needing repeated operations to handle this in a staged fashion was discussed.  High risk of death or at best prolonged ICU stay discussed as well.  The risks of no intervention will lead to serious problems including death.   I expressed a good likelihood that surgery will address the problem but that her shock and illness may be too far gone to reverse its course..    Goals of post-operative recovery were discussed as well.  We will work to minimize complications although  risks in an emergent setting are high.   Questions were answered.  The patient's family expressed understanding & wish to proceed with surgery to give her a chance.  While obviously upset, they appreciated the upfront honesty about the seriousness of this situation.  ICU bed ready & d/w Dr Deterding with CCM whose team will help with management.   -VTE prophylaxis- SCDs, etc  -mobilize as tolerated to help recovery    Adin Hector, M.D., F.A.C.S. Gastrointestinal and Minimally Invasive Surgery [facility] [address] [place], [place] [address] [phone] Main / Paging   May 15, 2013  Note: This dictation was prepared with Dragon/digital dictation along with [facility] technology. Any transcriptional errors that result from this process are unintentional.

## 2013-04-27 NOTE — ED Provider Notes (Signed)
D/[facility] gross [facility] Family requested [place] Dr gross will accept in transfer CT imaging pending (pt is hemodynamically stable) [facility] place [facility] as well as pt will stop in the ED  Joya Gaskinsonald W Osie Merkin, MD 04/06/2013 [phone]

## 2013-04-27 NOTE — Anesthesia Preprocedure Evaluation (Addendum)
Anesthesia Evaluation  Patient identified by MRN, date of birth, ID band Patient awake    Reviewed: Allergy & Precautions, H&P , NPO status , Patient's Chart, lab work & pertinent test results, reviewed documented beta blocker date and time   Airway Mallampati: II TM Distance: >3 FB Neck ROM: full    Dental no notable dental hx.    Pulmonary neg pulmonary ROS,  breath sounds clear to auscultation  Pulmonary exam normal       Cardiovascular hypertension, Pt. on home beta blockers + CAD, + Past MI and + CABG + dysrhythmias Atrial Fibrillation Rhythm:regular Rate:Normal  Cath 4/13 severe LAD/ RCA disease.  LV function OK   Neuro/Psych Raynaud's negative neurological ROS  negative psych ROS   GI/Hepatic negative GI ROS, Neg liver ROS, PUD, Stomach perforation with SQ emphysema severe   Endo/Other  negative endocrine ROSHypothyroidism   Renal/GU negative Renal ROS  negative genitourinary   Musculoskeletal   Abdominal   Peds  Hematology negative hematology ROS (+)   Anesthesia Other Findings   Reproductive/Obstetrics negative OB ROS                          Anesthesia Physical Anesthesia Plan  ASA: IV and emergent  Anesthesia Plan: General   Post-op Pain Management:    Induction: Intravenous  Airway Management Planned: Oral ETT  Additional Equipment:   Intra-op Plan:   Post-operative Plan: Post-operative intubation/ventilation  Informed Consent: I have reviewed the patients History and Physical, chart, labs and discussed the procedure including the risks, benefits and alternatives for the proposed anesthesia with the patient or authorized representative who has indicated his/her understanding and acceptance.   Dental Advisory Given  Plan Discussed with: CRNA and Surgeon  Anesthesia Plan Comments:         Anesthesia Quick Evaluation

## 2013-04-27 NOTE — Progress Notes (Signed)
[place], MD, FACS   [address], [place], [place] [address] Phone: [phone] FAX: [phone]    AVRIL BUSSER [id number] 1923-07-04  CARE TEAM:  PCP: Delphina Cahill, MD  Outpatient Care Team: Patient Care Team: Delphina Cahill, MD as PCP - General (Internal Medicine) Rogene Houston, MD as Consulting Physician (Gastroenterology) Sherren Mocha, MD as Consulting Physician (Cardiology)  Inpatient Treatment Team: Treatment Team: Attending Provider: Nolon Nations, MD; Consulting Physician: Md Pccm, MD; Registered Nurse: Leafy Kindle Main, RN   Subjective:  ICU RNs in room [facility] etc) On pressors w IVF boluses with MAP 40-50s.  +7L No more UOP Son in law at bedside  Objective:  Vital signs:  Filed Vitals:   05-17-13 0640 05-17-2013 0645 2013/05/17 0650 05-17-13 0700  BP: 76/32 68/32    Pulse:      Temp:      TempSrc:      Resp: 35 35 35 35  Height:      Weight:      SpO2:           Intake/Output   Yesterday:  02/27 0701 - 02/28 0700 In: 1497 [I.V.:4975; IV Piggyback:3000] Out: 700 [Urine:100; Drains:500; Blood:100] This shift:     Bowel function:  Flatus: n  BM: n  Drain: 500 serosanguinous wound vac  Physical Exam:  General: Pt intubated / sedated w low dose fent gtt Eyes: Sclera clear.  No icterus.  Edematous Neuro: No focal sensory/motor deficits. Lymph: No head/neck/groin lymphadenopathy Psych:  Sedated.  No agitation/combativeness HENT: Normocephalic, Mucus membranes moist.  No thrush.  ETT & OGT in place Neck: Supple, No tracheal deviation Chest:  No chest wall pain w good excursion CV:  Pulses thready carotid.  Regular rhythm MS: Normal AROM mjr joints.  No obvious deformity Abdomen: Distended.  Intraperitoneal wound vac in place.  No incarcerated hernias. Ext:  SCDs BLE.  1-2+ anasarca.  Mod cyanosis Skin: No petechiae / purpura   Problem List:   Active Problems:    Pneumoperitoneum of unknown etiology, probable gastric perforation   Mediastinal emphysema (pneumomediastinum)   Acute gastric perforation   Assessment  [name] Done  80 y.o. female  Day of Surgery  Procedure(s): POST-OPERATIVE DIAGNOSIS: massive gastric perforation   PROCEDURE: Procedure(s):  EXPLORATORY LAPAROTOMY  LYSIS OF ADHESIONS  SLEEVE GASTRECTOMY  APPLICATION OF INTRAPERITONEAL WOUND VAC   SURGEON:   Adin Hector, MD  Stark Klein, MD - Asst    GRIM with persistent shock & developing ARF   Plan:  -continue aggressive intervention for now.  Family probably leaning towards limited code > full code but no decision yet. -IV PPI -NGT decompression s/p partial sleeve-type gastrectomy -IV ABx -volume resuscitation -transfuse PRN.  Hgb >10 now  -if survives today (unlikely) plan exlap tomorrow for possible SB/colon ischemic resection if found & feeding jejunostomy. -VTE prophylaxis- SCDs, etc -  Adin Hector, M.D., F.A.C.S. Gastrointestinal and Minimally Invasive Surgery [facility] [address] [place],  [address] [phone] Main / Paging   05/17/2013   Results:   Labs: Results for orders placed during the hospital encounter of 04/06/2013 (from the past 48 hour(s))  CBC WITH DIFFERENTIAL     Status: Abnormal   Collection Time    04/10/2013 10:25 PM      Result Value Ref Range   WBC 14.2 (*) 4.0 - 10.5 K/uL   RBC 4.21  3.87 - 5.11 MIL/uL   Hemoglobin 13.1  12.0 - 15.0 g/dL   HCT 41.0  36.0 - 46.0 %   MCV 97.4  78.0 - 100.0 fL   MCH 31.1  26.0 - 34.0 pg   MCHC 32.0  30.0 - 36.0 g/dL   RDW 16.3 (*) 11.5 - 15.5 %   Platelets 289  150 - 400 K/uL   Neutrophils Relative % 83 (*) 43 - 77 %   Neutro Abs 11.7 (*) 1.7 - 7.7 K/uL   Lymphocytes Relative 10 (*) 12 - 46 %   Lymphs Abs 1.5  0.7 - 4.0 K/uL   Monocytes Relative 5  3 - 12 %   Monocytes Absolute 0.7  0.1 - 1.0 K/uL   Eosinophils Relative 2  0 - 5 %    Eosinophils Absolute 0.2  0.0 - 0.7 K/uL   Basophils Relative 0  0 - 1 %   Basophils Absolute 0.1  0.0 - 0.1 K/uL  COMPREHENSIVE METABOLIC PANEL     Status: Abnormal   Collection Time    04/12/2013 10:25 PM      Result Value Ref Range   Sodium 136 (*) 137 - 147 mEq/L   Potassium 4.7  3.7 - 5.3 mEq/L   Chloride 94 (*) 96 - 112 mEq/L   CO2 26  19 - 32 mEq/L   Glucose, Bld 202 (*) 70 - 99 mg/dL   BUN 26 (*) 6 - 23 mg/dL   Creatinine, Ser 1.22 (*) 0.50 - 1.10 mg/dL   Calcium 9.4  8.4 - 10.5 mg/dL   Total Protein 8.6 (*) 6.0 - 8.3 g/dL   Albumin 4.2  3.5 - 5.2 g/dL   AST 88 (*) 0 - 37 U/L   ALT 33  0 - 35 U/L   Alkaline Phosphatase 100  39 - 117 U/L   Total Bilirubin 0.6  0.3 - 1.2 mg/dL   GFR calc non Af Amer 39 (*) >90 mL/min   GFR calc Af Amer 45 (*) >90 mL/min   Comment: (NOTE)     The eGFR has been calculated using the CKD EPI equation.     This calculation has not been validated in all clinical situations.     eGFR's persistently <90 mL/min signify possible Chronic Kidney     Disease.  LIPASE, BLOOD     Status: Abnormal   Collection Time    04/06/2013 10:25 PM      Result Value Ref Range   Lipase >3000 (*) 11 - 59 U/L  TROPONIN I     Status: None   Collection Time    04/11/2013 10:25 PM      Result Value Ref Range   Troponin I <0.30  <0.30 ng/mL   Comment:            Due to the release kinetics of cTnI,     a negative result within the first hours     of the onset of symptoms does not rule out     myocardial infarction with certainty.     If myocardial infarction is still suspected,     repeat the test at appropriate intervals.  LACTIC ACID, PLASMA     Status: Abnormal   Collection Time    04/16/2013 10:56 PM      Result Value Ref Range   Lactic Acid, Venous 3.8 (*) 0.5 - 2.2 mmol/L  BLOOD GAS, ARTERIAL     Status: Abnormal   Collection Time  05-20-13  2:10 AM      Result Value Ref Range   FIO2 1.00     Delivery systems VENTILATOR     Mode PRESSURE REGULATED VOLUME  CONTROL     VT 500     Rate 16     Peep/cpap 5.0     pH, Arterial 7.151 (*) 7.350 - 7.450   Comment: CRITICAL RESULT CALLED TO, READ BACK BY AND VERIFIED WITH:      Delora Fuel, MD AT 1194 BY TABATHA KNAPP, RRT, RCP ON 05-20-2013   pCO2 arterial 63.1 (*) 35.0 - 45.0 mmHg   Comment: CRITICAL RESULT CALLED TO, READ BACK BY AND VERIFIED WITH:      Delora Fuel, MD AT 1740 BY TABATHA KNAPP, RRT, RCP ON 05-20-2013   pO2, Arterial 0.0 (*) 80.0 - 100.0 mmHg   Comment: CRITICAL RESULT CALLED TO, READ BACK BY AND VERIFIED WITH:      Delora Fuel, MD AT 8144 BY TABATHA KNAPP, RRT, RCP ON 05/20/13. VALUE BELOW REPORTABLE RANGE.   Bicarbonate 21.5  20.0 - 24.0 mEq/L   TCO2 21.3  0 - 100 mmol/L   Acid-base deficit 8.0 (*) 0.0 - 2.0 mmol/L   O2 Saturation 28.3     Patient temperature 96.7     Collection site BRACHIAL ARTERY     Drawn by [id number]     Sample type       Value:  VENOUS BLOOD SAMPLE OBTAINED WITH ARTERIAL ATTEMPT  BLOOD GAS, ARTERIAL     Status: Abnormal   Collection Time    May 20, 2013  5:16 AM      Result Value Ref Range   FIO2 1.00     Delivery systems VENTILATOR     Mode PRESSURE REGULATED VOLUME CONTROL     VT 500     Rate 22.0     Peep/cpap 5.0     pH, Arterial 7.110 (*) 7.350 - 7.450   Comment: CRITICAL RESULT CALLED TO, READ BACK BY AND VERIFIED WITH:      BROOKE WHITE, RN AT 0525 BY TABATHA KNAPP, RRT, RCP ON 05/20/13   pCO2 arterial 41.7  35.0 - 45.0 mmHg   pO2, Arterial 69.9 (*) 80.0 - 100.0 mmHg   Bicarbonate 12.7 (*) 20.0 - 24.0 mEq/L   TCO2 13.2  0 - 100 mmol/L   Acid-base deficit 15.1 (*) 0.0 - 2.0 mmol/L   O2 Saturation 91.0     Patient temperature 98.6     Collection site  RIGHT BRACHIAL     Drawn by  Caro Hight, RRT     Sample type ARTERIAL DRAW    BLOOD GAS, ARTERIAL     Status: Abnormal   Collection Time    2013-05-20  6:35 AM      Result Value Ref Range   FIO2 1.00     Delivery systems VENTILATOR     Mode PRESSURE REGULATED VOLUME CONTROL     VT 500      Rate 35.0     Peep/cpap 5.0     pH, Arterial 7.234 (*) 7.350 - 7.450   pCO2 arterial 32.1 (*) 35.0 - 45.0 mmHg   pO2, Arterial 49.7 (*) 80.0 - 100.0 mmHg   Bicarbonate 13.1 (*) 20.0 - 24.0 mEq/L   TCO2 13.4  0 - 100 mmol/L   Acid-base deficit 12.9 (*) 0.0 - 2.0 mmol/L   O2 Saturation 85.0     Patient temperature 98.6     Collection site ARTERIAL LINE  Drawn by [id number]     Sample type ARTERIAL DRAW      Imaging / Studies: Ct Chest W Contrast  2013/05/08   CLINICAL DATA:  Shaft epigastric pain, nausea. Prior cholecystectomy, appendectomy, hiatal hernia repair, and Roux-en-Y.  EXAM: CT CHEST, ABDOMEN, AND PELVIS WITH CONTRAST  TECHNIQUE: Multidetector CT imaging of the chest, abdomen and pelvis was performed following the standard protocol during bolus administration of intravenous contrast.  CONTRAST:  66m OMNIPAQUE IOHEXOL 300 MG/ML SOLN, 1079mOMNIPAQUE IOHEXOL 300 MG/ML SOLN  COMPARISON:  Chest/abdominal radiographs dated 04/10/2013. CT abdomen pelvis dated 04/08/2013.  FINDINGS: CT CHEST FINDINGS  Extensive subcutaneous emphysema in the bilateral neck, bilateral chest wall, and right mid/lower back.  Moderate pneumomediastinum and possible trace pneumopericardium (series 3/ image 34).  Possible tiny right anterior pneumothorax (series 3/ image 28).  Mild patchy opacities in the bilateral lower lobes, likely atelectasis. Trace bilateral pleural effusions, left greater than right.  The heart is normal in size. Coronary atherosclerosis. Postsurgical changes related to prior CABG. Atherosclerotic calcifications of the aortic arch.  Fluid/debris within the lower esophagus.  Visualized osseous structures are within normal limits.  CT ABDOMEN AND PELVIS FINDINGS  Postsurgical changes at the GE junction related to Nissen fundoplication.  Suspected discontinuity/frank perforation involving the posterior aspect of the gastric fundus (series 3/image 53; series 4/image 54). Associated gas and layering  fluid/debris within an 18.8 x 11.8 x 19.9 cm extraluminal collection in the left mid abdomen (series 3/ image 67). Associated large volume mesenteric gas within the right mid abdomen (series 3/ image 61), moderate bilateral retroperitoneal gas (series 2/ image 79), moderate periportal gas, and portal venous gas.  Liver is otherwise unremarkable. Spleen, pancreas, and adrenal glands are grossly unremarkable.  Status post cholecystectomy.  Bilateral renal atrophy with scattered probable tiny renal cysts. No hydronephrosis.  Multiple mildly prominent loops of small and large bowel, favored to reflect secondary adynamic ileus.  Multiple small midline/paramidline ventral hernias.  No abdominopelvic ascites.  No suspicious abdominopelvic lymphadenopathy.  Uterus and bilateral ovaries are unremarkable.  Bladder is decompressed with indwelling Foley catheter.  Degenerative changes of the lumbar spine, most prominent at L1-2.  IMPRESSION: Suspected discontinuity/frank perforation involving the posterior aspect of the gastric fundus. Associated gas and layering debris within a 19.9 cm extraluminal collection in the left mid abdomen.  Associated pneumoperitoneum, retroperitoneum, and portal venous gas. Associated pneumomediastinum, possible trace pneumopericardium, and possible tiny right anterior pneumothorax. Extensive subcutaneous emphysema involving the bilateral neck, bilateral chest, and right back.  Multiple mildly prominent loops of small and large bowel, favored to reflect secondary adynamic ileus.  Postsurgical changes at the GE junction related to Nissen fundoplication. Fluid within the distal esophagus.  Additional ancillary findings as above.  Critical value/emergent results were called by telephone at the time of interpretation on 04/01/10/2015t 12:51 AM to Dr. DORipley Fraisewho verbally acknowledged these results. Findings were also discussed with Dr. GrJohney Mainen 022015/03/12t 1248 hr.   Electronically Signed    By: SrJulian Hy.D.   On: 0203-12-20151:08   Ct Abdomen Pelvis W Contrast  03/2013-03-12 CLINICAL DATA:  Shaft epigastric pain, nausea. Prior cholecystectomy, appendectomy, hiatal hernia repair, and Roux-en-Y.  EXAM: CT CHEST, ABDOMEN, AND PELVIS WITH CONTRAST  TECHNIQUE: Multidetector CT imaging of the chest, abdomen and pelvis was performed following the standard protocol during bolus administration of intravenous contrast.  CONTRAST:  5010mMNIPAQUE IOHEXOL 300 MG/ML SOLN, 100m19mNIPAQUE IOHEXOL 300 MG/ML SOLN  COMPARISON:  Chest/abdominal radiographs dated 04/24/2013. CT abdomen pelvis dated 04/08/2013.  FINDINGS: CT CHEST FINDINGS  Extensive subcutaneous emphysema in the bilateral neck, bilateral chest wall, and right mid/lower back.  Moderate pneumomediastinum and possible trace pneumopericardium (series 3/ image 34).  Possible tiny right anterior pneumothorax (series 3/ image 28).  Mild patchy opacities in the bilateral lower lobes, likely atelectasis. Trace bilateral pleural effusions, left greater than right.  The heart is normal in size. Coronary atherosclerosis. Postsurgical changes related to prior CABG. Atherosclerotic calcifications of the aortic arch.  Fluid/debris within the lower esophagus.  Visualized osseous structures are within normal limits.  CT ABDOMEN AND PELVIS FINDINGS  Postsurgical changes at the GE junction related to Nissen fundoplication.  Suspected discontinuity/frank perforation involving the posterior aspect of the gastric fundus (series 3/image 53; series 4/image 54). Associated gas and layering fluid/debris within an 18.8 x 11.8 x 19.9 cm extraluminal collection in the left mid abdomen (series 3/ image 67). Associated large volume mesenteric gas within the right mid abdomen (series 3/ image 61), moderate bilateral retroperitoneal gas (series 2/ image 79), moderate periportal gas, and portal venous gas.  Liver is otherwise unremarkable. Spleen, pancreas, and adrenal glands  are grossly unremarkable.  Status post cholecystectomy.  Bilateral renal atrophy with scattered probable tiny renal cysts. No hydronephrosis.  Multiple mildly prominent loops of small and large bowel, favored to reflect secondary adynamic ileus.  Multiple small midline/paramidline ventral hernias.  No abdominopelvic ascites.  No suspicious abdominopelvic lymphadenopathy.  Uterus and bilateral ovaries are unremarkable.  Bladder is decompressed with indwelling Foley catheter.  Degenerative changes of the lumbar spine, most prominent at L1-2.  IMPRESSION: Suspected discontinuity/frank perforation involving the posterior aspect of the gastric fundus. Associated gas and layering debris within a 19.9 cm extraluminal collection in the left mid abdomen.  Associated pneumoperitoneum, retroperitoneum, and portal venous gas. Associated pneumomediastinum, possible trace pneumopericardium, and possible tiny right anterior pneumothorax. Extensive subcutaneous emphysema involving the bilateral neck, bilateral chest, and right back.  Multiple mildly prominent loops of small and large bowel, favored to reflect secondary adynamic ileus.  Postsurgical changes at the GE junction related to Nissen fundoplication. Fluid within the distal esophagus.  Additional ancillary findings as above.  Critical value/emergent results were called by telephone at the time of interpretation on 05-03-2013 at 12:51 AM to Dr. Ripley Fraise, who verbally acknowledged these results. Findings were also discussed with Dr. Johney Maine on 2013-05-03 at 1248 hr.   Electronically Signed   By: Julian Hy M.D.   On: 2013/05/03 01:08   Portable Chest Xray  May 03, 2013   CLINICAL DATA:  Endotracheal tube placement.  EXAM: PORTABLE CHEST - 1 VIEW  COMPARISON:  Chest radiograph performed earlier today at 12:52 a.m.  FINDINGS: The patient's endotracheal tube is seen ending 2 cm above the carina. The enteric tube is noted extending below the diaphragm. A right apical  chest tube is noted.  The lungs are difficult to fully assess due to the extent of soft tissue air throughout the chest wall. The trace right-sided pneumothorax noted on CT is not well characterized on radiograph. Underlying airspace opacities may reflect atelectasis. No definite pleural effusion is identified.  The cardiomediastinal silhouette is borderline enlarged. Pneumomediastinum is again noted. The patient is status post median sternotomy, with evidence of prior CABG. No acute osseous abnormalities are seen. Extensive soft tissue air is noted throughout the chest wall and within the neck.  IMPRESSION: 1. Endotracheal tube seen ending 2 cm above the carina. 2. Right apical chest  tube noted; previously noted trace right-sided pneumothorax is not well characterized on radiograph. 3. Underlying airspace opacities may reflect atelectasis. Borderline cardiomegaly noted. 4. Pneumomediastinum again noted. 5. Extensive soft tissue air again seen throughout the chest wall and within the neck.   Electronically Signed   By: Garald Balding M.D.   On: 30-Apr-2013 05:49   Dg Chest Portable 1 View  2013/04/30   CLINICAL DATA:  ETT placement  EXAM: PORTABLE CHEST - 1 VIEW  COMPARISON:  04/06/2013  FINDINGS: Endotracheal tube terminates 2.7 cm above the carina.  Low lung volumes. Possible tiny right pneumothorax on prior chest CT is not definitely evident on chest radiograph. Known pneumomediastinum is better visualized on CT.  Heart is top-normal in size. Postsurgical changes related to prior CABG.  Extensive subcutaneous emphysema in the bilateral neck a and chest wall, right greater than left. This is significantly increased from prior CT.  Prior suspected pneumoperitoneum is not well visualized on the study.  IMPRESSION: Endotracheal tube terminates 2.7 cm above the carina.  Additional findings are better visualized on CT.   Electronically Signed   By: Julian Hy M.D.   On: 2013-04-30 01:42   Dg Chest Port 1  View  04/09/2013   CLINICAL DATA:  Abdominal pain, nausea/ vomiting, lethargy  EXAM: PORTABLE CHEST - 1 VIEW  COMPARISON:  None.  FINDINGS: Low lung volumes.  No focal consolidation.  Eventration of the right hemidiaphragm.  Mild cardiomegaly. Postsurgical changes related to prior CABG.  Possible lucency beneath the right liver, free air not excluded. Correlate with pending CT abdomen/pelvis.  Suspected subcutaneous emphysema in the neck, right greater than left.  IMPRESSION: Low lung volumes.  No evidence of acute cardiopulmonary disease.  Possible lucency beneath the right liver, free air not excluded. Correlate with pending CT abdomen/pelvis.  Suspected subcutaneous emphysema in the neck, right greater than left.  Critical value/emergent results were called by telephone at the time of interpretation on 04/24/2013 at 11:31 PM to Dr. Christy Gentles, who verbally acknowledged these results.   Electronically Signed   By: Julian Hy M.D.   On: 04/14/2013 23:32   Dg Abd Portable 2v  04/15/2013   CLINICAL DATA:  Abdominal pain, nausea/vomiting, lethargy  EXAM: PORTABLE ABDOMEN - 2 VIEW  COMPARISON:  CT abdomen pelvis dated 04/08/2013  FINDINGS: Lucency beneath the right liver is worrisome for pneumoperitoneum. Suspected Rigler's sign in the right mid abdomen.  Moderate gaseous distention with debris.  Surgical clips in the left upper abdomen.  Diffuse gaseous distention of small and large bowel.  IMPRESSION: Findings suspicious for pneumoperitoneum. Correlate with pending CT abdomen pelvis.  Critical value/emergent results were called by telephone at the time of interpretation on 03/30/2013 at 11:32 PM to Dr. Christy Gentles, who verbally acknowledged these results.   Electronically Signed   By: Julian Hy M.D.   On: 04/01/2013 23:34    Medications / Allergies: per chart  Antibiotics: Anti-infectives   Start     Dose/Rate Route Frequency Ordered Stop   04-30-13 0800  fluconazole (DIFLUCAN) IVPB 200 mg      200 mg 100 mL/hr over 60 Minutes Intravenous Every 24 hours 04-30-2013 0608     Apr 30, 2013 0300  piperacillin-tazobactam (ZOSYN) IVPB 3.375 g     3.375 g 12.5 mL/hr over 240 Minutes Intravenous 3 times per day 04/30/2013 0228     04/05/2013 2330  piperacillin-tazobactam (ZOSYN) IVPB 3.375 g  Status:  Discontinued     3.375 g 100 mL/hr over 30 Minutes  Intravenous  Once 04/14/2013 2329 05-05-2013 0228       Note: This dictation was prepared with Dragon/digital dictation along with [facility] technology. Any transcriptional errors that result from this process are unintentional.

## 2013-04-27 NOTE — ED Provider Notes (Addendum)
Patient arrived via ambulance [facility]. En route, and she was noted to be hypotensive and mottled. On arrival, she is hypotensive with blood pressure 74 systolic. An ET tube is in place with symmetric breath sounds. Heart tones are somewhat distant. Abdomen is distended and tense. Massive subcutaneous emphysema is present in the trunk and neck. Extremities show mottled skin. Dr. Michaell CowingGross from general surgery has been consulted and is coming to take the patient to the operating room. She's given additional IV fluids. Family has arrived and I have explained to them the seriousness of her condition and Dr. Michaell CowingGross also stressed the fact that she likely has a lethal condition. Family you stated he wanted to give her a chance at survival so she is taken to the operating room. I've also discussed the case with Dr. Darrick Pennaeterding of critical care since they will need to be involved with her care once she needs the operating room.  CRITICAL CARE Performed by: Dione BoozeGLICK,Margurete Guaman Total critical care time: 35 minutes Critical care time was exclusive of separately billable procedures and treating other patients. Critical care was necessary to treat or prevent imminent or life-threatening deterioration. Critical care was time spent personally by me on the following activities: development of treatment plan with patient and/or surrogate as well as nursing, discussions with consultants, evaluation of patient's response to treatment, examination of patient, obtaining history from patient or surrogate, ordering and performing treatments and interventions, ordering and review of laboratory studies, ordering and review of radiographic studies, pulse oximetry and re-evaluation of patient's condition.   Dione Boozeavid Saidee Geremia, MD 04/20/2013 [id number]  Dione Boozeavid Jarelyn Bambach, MD 04/10/2013 [id number]

## 2013-04-27 NOTE — ED Notes (Signed)
Dr Michaell CowingGross notified pt is nearly at ED. Dr. Preston FleetingGlick to take lead on pt, surgery is consult. Pt arrived with mottling to lower extremities, no palpable pulses. Hypotensive, withdraws to touch. Subq air noted chest, neck, peri orbital swelling noted. Pt is intubated, + lung sounds. Pt is hypothermic, 96.7 Rectal, bare hugger applied

## 2013-04-27 NOTE — Consult Note (Addendum)
Name: [name] MRN: [id number] DOB: 12/23/1923    ADMISSION DATE:  04/04/2013 CONSULTATION DATE:  May 04, 2013  REFERRING MD :  Dr. Michaell Cowing PRIMARY SERVICE: Surgery  CHIEF COMPLAINT:  Massive gastric perforation, shock. Post exploratory laparotomy, sleeve gastrectomy.  BRIEF PATIENT DESCRIPTION:  80 years old female with PMH relevant for CAD post CABG, ischemic colitis, PVD, paroxysmal A. Fib. Admitted with severe abdominal pain. CT of the abdomen revealed gastric perforation. Developed shock requiring intubation. Now in the ICU post exploratory laparotomy and sleeve gastrectomy.   SIGNIFICANT EVENTS / STUDIES:  IMPRESSION:  Suspected discontinuity/frank perforation involving the posterior  aspect of the gastric fundus. Associated gas and layering debris  within a 19.9 cm extraluminal collection in the left mid abdomen.  Associated pneumoperitoneum, retroperitoneum, and portal venous gas.  Associated pneumomediastinum, possible trace pneumopericardium, and  possible tiny right anterior pneumothorax. Extensive subcutaneous  emphysema involving the bilateral neck, bilateral chest, and right  back.  Multiple mildly prominent loops of small and large bowel, favored to  reflect secondary adynamic ileus.  Postsurgical changes at the GE junction related to Nissen  fundoplication. Fluid within the distal esophagus.   LINES / TUBES: - Left IJ CVC - Left radial A. Line - ETT - OGT - Foley catheter - Peripheral IV's  CULTURES:   ANTIBIOTICS: - Zosyn  HISTORY OF PRESENT ILLNESS:   80 years old female with PMH relevant for CAD post CABG, ischemic colitis, PVD, paroxysmal A. Fib. Admitted initially to [facility] and then transferred to [facility] with severe abdominal pain. CT of the abdomen revealed pneumoperitoneum and massive gastric perforation. Developed shock requiring intubation and pressors. Was taken to the OR for exploratory laparotomy that confirmed a massive gastric  perforation. Underwent sleeve gastrectomy. Now out of the OR intubated in shock on norepinephrine and phenylephrine and still hypotensive. On exam intubated, unresponsive. EKG monitor shows A. Fib with HR in the 80's, MAP 40 to 50. PCCM consult called to assist with management.   PAST MEDICAL HISTORY :  Past Medical History  Diagnosis Date  . CAD (coronary artery disease)     a.  s/p MI;  b. s/p CABG x 4 - LIMA->LAD, VG->DIAG, VG->PLA, VG->PDA;  c. Cath 05/2011 - LM 50-60%,  severe LAD/RCA dzs,  LIMA atretic, VG's patent;  d. Myoview 05/2005 (done after cath) EF 72%, no ischemia, fixed ant thinning.  . Carotid artery disease   . Paroxysmal a-fib     a. no longer on coumadin 2/2 h/o falls.  Marland Kitchen HTN (hypertension)   . Hyperlipidemia   . Arthropathy, unspecified, other specified sites   . Sacroiliac joint dysfunction   . Spinal stenosis   . Anemia     iron deficiency  . Hypercalcemia   . Osteoarthrosis, unspecified whether generalized or localized, ankle and foot   . Tibialis tendinitis   . Benign paroxysmal positional vertigo   . Prerenal azotemia     mild  . Anemia, macrocytic   . Raynaud's syndrome   . Impingement syndrome of shoulder region     unspecified area  . Osteoporosis, unspecified   . Shoulder pain, bilateral   . Chronic low back pain   . Arthritis   . Constipation   . PUD (peptic ulcer disease)   . Depression   . Anxiety   . Allergic rhinitis   . Fatigue   . ISCHEMIC COLITIS 09/24/2006    Qualifier: Diagnosis of  By: Julian Reil LPN, Sherrie    . Nausea  with vomiting 01/13/2008    Qualifier: Diagnosis of  By: Erby PianFerreira MD, Remi Haggardornelius    . Diverticulosis of colon 2008     Colonoscopy    Past Surgical History  Procedure Laterality Date  . Appendectomy    . Cholecystectomy    . Laparotomy      exploratory  . Coronary artery bypass graft      4 grafts - 1998  . Hiatal hernia repair  1998  . Cataract extraction    . Esophagogastroduodenoscopy N/A 03/27/2013     Procedure: ESOPHAGOGASTRODUODENOSCOPY (EGD);  Surgeon: Malissa HippoNajeeb U Rehman, MD;  Location: [facility];  Service: Endoscopy;  Laterality: N/A;  255-moved to 200 Ann to notify pt   Prior to Admission medications   Medication Sig Start Date End Date Taking? Authorizing Provider  ALPRAZolam Prudy Feeler(XANAX) 1 MG tablet Take 1.5 mg by mouth daily.    Yes Historical Provider, MD  aspirin EC 81 MG EC tablet Take 81 mg by mouth every morning.  06/14/10  Yes Tonny BollmanMichael Cooper, MD  Biotin 1000 MCG tablet Take 1,000 mcg by mouth 2 (two) times daily.    Yes Historical Provider, MD  ipratropium (ATROVENT) 0.06 % nasal spray Place 3 sprays into both nostrils daily.    Yes Historical Provider, MD  levothyroxine (SYNTHROID, LEVOTHROID) 88 MCG tablet Take 88 mcg by mouth daily before breakfast.    Yes Historical Provider, MD  metoprolol succinate (TOPROL-XL) 25 MG 24 hr tablet Take 12.5 mg by mouth every morning.    Yes Historical Provider, MD  omeprazole (PRILOSEC) 20 MG capsule Take 1 capsule (20 mg total) by mouth daily. 03/27/13  Yes Malissa HippoNajeeb U Rehman, MD  promethazine (PHENERGAN) 25 MG tablet Take 25 mg by mouth every 6 (six) hours as needed for nausea or vomiting.   Yes Historical Provider, MD  ramipril (ALTACE) 5 MG capsule Take 5 mg by mouth every morning.   Yes Historical Provider, MD  senna (SENOKOT) 8.6 MG tablet Take 1 tablet by mouth daily as needed for constipation.    Yes Historical Provider, MD  citalopram (CELEXA) 20 MG tablet Take 20 mg by mouth every morning.    Historical Provider, MD  DULoxetine (CYMBALTA) 30 MG capsule Take 30 mg by mouth every morning.    Historical Provider, MD  fluticasone (FLONASE) 50 MCG/ACT nasal spray Place 1-2 sprays into the nose daily as needed for allergies or rhinitis.  03/28/13   Historical Provider, MD  nitroGLYCERIN (NITROSTAT) 0.4 MG SL tablet Place 1 tablet (0.4 mg total) under the tongue every 5 (five) minutes as needed for chest pain. 06/08/11   Ok Anishristopher R Berge, NP  OVER  THE COUNTER MEDICATION Take 1 tablet by mouth every morning. Hair, Skin, and Nails OTC supplement    Historical Provider, MD   Allergies  Allergen Reactions  . Codeine Nausea And Vomiting  . Tramadol Nausea And Vomiting  . Ultracet [Tramadol-Acetaminophen] Nausea And Vomiting    FAMILY HISTORY:  No family history on file. SOCIAL HISTORY:  reports that she has never smoked. She does not have any smokeless tobacco history on file. She reports that she does not drink alcohol or use illicit drugs.  REVIEW OF SYSTEMS:  Unable to provide.  SUBJECTIVE:   VITAL SIGNS: Temp:  [96.7 F (35.9 C)-97.5 F (36.4 C)] 96.7 F (35.9 C) (02/28 0210) Pulse Rate:  [44-88] 79 (02/28 0605) Resp:  [18-35] 35 (02/28 0605) BP: (67-220)/(20-89) 95/33 mmHg (02/28 0605) SpO2:  [91 %-100 %] 96 % (  02/28 0605) FiO2 (%):  [100 %] 100 % (02/28 0510) Weight:  [135 lb (61.236 kg)] 135 lb (61.236 kg) (02/27 2217) HEMODYNAMICS:   VENTILATOR SETTINGS: Vent Mode:  [-] PRVC FiO2 (%):  [100 %] 100 % Set Rate:  [16 bmp-35 bmp] 35 bmp Vt Set:  [500 mL] 500 mL PEEP:  [5 cmH20] 5 cmH20 Plateau Pressure:  [21 cmH20-30 cmH20] 21 cmH20 INTAKE / OUTPUT: Intake/Output     02/27 0701 - 02/28 0700   I.V. (mL/kg) 4600 (75.1)   IV Piggyback 3000   Total Intake(mL/kg) 7600 (124.1)   Urine (mL/kg/hr) 100   Blood 100   Total Output 200   Net +7400         PHYSICAL EXAMINATION: General: Unresponsive, intubated, pale, ill appearance.  Eyes: Anicteric sclerae. Pupils are equal and reactive to light.  ENT: ETT in place. Trachea at midline.  Lymph: No cervical, supraclavicular, or axillary lymphadenopathy. Heart: Irregular rhythm. No murmurs, rubs, or gallops appreciated. No bruits, equal pulses. Lungs: Normal excursion, no dullness to percussion. Good air movement bilaterally, without wheezes or crackles.  Abdomen: Surgical wound covered with wound vac. No bowel sounds. Musculoskeletal: No clubbing or synovitis. No LE  edema Skin: No rashes or lesions Neuro: Patient is unresponsive.    LABS:  CBC  Recent Labs Lab 04/24/2013 2225  WBC 14.2*  HGB 13.1  HCT 41.0  PLT 289   Coag's No results found for this basename: APTT, INR,  in the last 168 hours BMET  Recent Labs Lab 04/05/2013 2225  NA 136*  K 4.7  CL 94*  CO2 26  BUN 26*  CREATININE 1.22*  GLUCOSE 202*   Electrolytes  Recent Labs Lab 04/16/2013 2225  CALCIUM 9.4   Sepsis Markers  Recent Labs Lab 04/10/2013 2256  LATICACIDVEN 3.8*   ABG  Recent Labs Lab 04/15/2013 0210 04/13/2013 0516  PHART 7.151* 7.110*  PCO2ART 63.1* 41.7  PO2ART 0.0* 69.9*   Liver Enzymes  Recent Labs Lab 04/19/2013 2225  AST 88*  ALT 33  ALKPHOS 100  BILITOT 0.6  ALBUMIN 4.2   Cardiac Enzymes  Recent Labs Lab 03/31/2013 2225  TROPONINI <0.30   Glucose No results found for this basename: GLUCAP,  in the last 168 hours  Imaging Ct Chest W Contrast  04/13/2013   CLINICAL DATA:  Shaft epigastric pain, nausea. Prior cholecystectomy, appendectomy, hiatal hernia repair, and Roux-en-Y.  EXAM: CT CHEST, ABDOMEN, AND PELVIS WITH CONTRAST  TECHNIQUE: Multidetector CT imaging of the chest, abdomen and pelvis was performed following the standard protocol during bolus administration of intravenous contrast.  CONTRAST:  50mL OMNIPAQUE IOHEXOL 300 MG/ML SOLN, OMNIPAQUE IOHEXOL 300 MG/ML SOLN  COMPARISON:  Chest/abdominal radiographs dated 04/16/2013. CT abdomen pelvis dated 04/08/2013.  FINDINGS: CT CHEST FINDINGS  Extensive subcutaneous emphysema in the bilateral neck, bilateral chest wall, and right mid/lower back.  Moderate pneumomediastinum and possible trace pneumopericardium (series 3/ image 34).  Possible tiny right anterior pneumothorax (series 3/ image 28).  Mild patchy opacities in the bilateral lower lobes, likely atelectasis. Trace bilateral pleural effusions, left greater than right.  The heart is normal in size. Coronary atherosclerosis.  Postsurgical changes related to prior CABG. Atherosclerotic calcifications of the aortic arch.  Fluid/debris within the lower esophagus.  Visualized osseous structures are within normal limits.  CT ABDOMEN AND PELVIS FINDINGS  Postsurgical changes at the GE junction related to Nissen fundoplication.  Suspected discontinuity/frank perforation involving the posterior aspect of the gastric fundus (series 3/image 53;  series 4/image 54). Associated gas and layering fluid/debris within an 18.8 x 11.8 x 19.9 cm extraluminal collection in the left mid abdomen (series 3/ image 67). Associated large volume mesenteric gas within the right mid abdomen (series 3/ image 61), moderate bilateral retroperitoneal gas (series 2/ image 79), moderate periportal gas, and portal venous gas.  Liver is otherwise unremarkable. Spleen, pancreas, and adrenal glands are grossly unremarkable.  Status post cholecystectomy.  Bilateral renal atrophy with scattered probable tiny renal cysts. No hydronephrosis.  Multiple mildly prominent loops of small and large bowel, favored to reflect secondary adynamic ileus.  Multiple small midline/paramidline ventral hernias.  No abdominopelvic ascites.  No suspicious abdominopelvic lymphadenopathy.  Uterus and bilateral ovaries are unremarkable.  Bladder is decompressed with indwelling Foley catheter.  Degenerative changes of the lumbar spine, most prominent at L1-2.  IMPRESSION: Suspected discontinuity/frank perforation involving the posterior aspect of the gastric fundus. Associated gas and layering debris within a 19.9 cm extraluminal collection in the left mid abdomen.  Associated pneumoperitoneum, retroperitoneum, and portal venous gas. Associated pneumomediastinum, possible trace pneumopericardium, and possible tiny right anterior pneumothorax. Extensive subcutaneous emphysema involving the bilateral neck, bilateral chest, and right back.  Multiple mildly prominent loops of small and large bowel, favored  to reflect secondary adynamic ileus.  Postsurgical changes at the GE junction related to Nissen fundoplication. Fluid within the distal esophagus.  Additional ancillary findings as above.  Critical value/emergent results were called by telephone at the time of interpretation on 03/31/2013 at 12:51 AM to Dr. Zadie Rhine, who verbally acknowledged these results. Findings were also discussed with Dr. Michaell Cowing on 04/05/2013 at 1248 hr.   Electronically Signed   By: Charline Bills M.D.   On: 04/04/2013 01:08   Ct Abdomen Pelvis W Contrast  04/13/2013   CLINICAL DATA:  Shaft epigastric pain, nausea. Prior cholecystectomy, appendectomy, hiatal hernia repair, and Roux-en-Y.  EXAM: CT CHEST, ABDOMEN, AND PELVIS WITH CONTRAST  TECHNIQUE: Multidetector CT imaging of the chest, abdomen and pelvis was performed following the standard protocol during bolus administration of intravenous contrast.  CONTRAST:  50mL OMNIPAQUE IOHEXOL 300 MG/ML SOLN, OMNIPAQUE IOHEXOL 300 MG/ML SOLN  COMPARISON:  Chest/abdominal radiographs dated Apr 27, 2013. CT abdomen pelvis dated 04/08/2013.  FINDINGS: CT CHEST FINDINGS  Extensive subcutaneous emphysema in the bilateral neck, bilateral chest wall, and right mid/lower back.  Moderate pneumomediastinum and possible trace pneumopericardium (series 3/ image 34).  Possible tiny right anterior pneumothorax (series 3/ image 28).  Mild patchy opacities in the bilateral lower lobes, likely atelectasis. Trace bilateral pleural effusions, left greater than right.  The heart is normal in size. Coronary atherosclerosis. Postsurgical changes related to prior CABG. Atherosclerotic calcifications of the aortic arch.  Fluid/debris within the lower esophagus.  Visualized osseous structures are within normal limits.  CT ABDOMEN AND PELVIS FINDINGS  Postsurgical changes at the GE junction related to Nissen fundoplication.  Suspected discontinuity/frank perforation involving the posterior aspect of the  gastric fundus (series 3/image 53; series 4/image 54). Associated gas and layering fluid/debris within an 18.8 x 11.8 x 19.9 cm extraluminal collection in the left mid abdomen (series 3/ image 67). Associated large volume mesenteric gas within the right mid abdomen (series 3/ image 61), moderate bilateral retroperitoneal gas (series 2/ image 79), moderate periportal gas, and portal venous gas.  Liver is otherwise unremarkable. Spleen, pancreas, and adrenal glands are grossly unremarkable.  Status post cholecystectomy.  Bilateral renal atrophy with scattered probable tiny renal cysts. No hydronephrosis.  Multiple mildly prominent loops of  small and large bowel, favored to reflect secondary adynamic ileus.  Multiple small midline/paramidline ventral hernias.  No abdominopelvic ascites.  No suspicious abdominopelvic lymphadenopathy.  Uterus and bilateral ovaries are unremarkable.  Bladder is decompressed with indwelling Foley catheter.  Degenerative changes of the lumbar spine, most prominent at L1-2.  IMPRESSION: Suspected discontinuity/frank perforation involving the posterior aspect of the gastric fundus. Associated gas and layering debris within a 19.9 cm extraluminal collection in the left mid abdomen.  Associated pneumoperitoneum, retroperitoneum, and portal venous gas. Associated pneumomediastinum, possible trace pneumopericardium, and possible tiny right anterior pneumothorax. Extensive subcutaneous emphysema involving the bilateral neck, bilateral chest, and right back.  Multiple mildly prominent loops of small and large bowel, favored to reflect secondary adynamic ileus.  Postsurgical changes at the GE junction related to Nissen fundoplication. Fluid within the distal esophagus.  Additional ancillary findings as above.  Critical value/emergent results were called by telephone at the time of interpretation on 04/05/2013 at 12:51 AM to Dr. Zadie Rhine, who verbally acknowledged these results. Findings were  also discussed with Dr. Michaell Cowing on 04/05/2013 at 1248 hr.   Electronically Signed   By: Charline Bills M.D.   On: 03/30/2013 01:08   Portable Chest Xray  04/10/2013   CLINICAL DATA:  Endotracheal tube placement.  EXAM: PORTABLE CHEST - 1 VIEW  COMPARISON:  Chest radiograph performed earlier today at 12:52 a.m.  FINDINGS: The patient's endotracheal tube is seen ending 2 cm above the carina. The enteric tube is noted extending below the diaphragm. A right apical chest tube is noted.  The lungs are difficult to fully assess due to the extent of soft tissue air throughout the chest wall. The trace right-sided pneumothorax noted on CT is not well characterized on radiograph. Underlying airspace opacities may reflect atelectasis. No definite pleural effusion is identified.  The cardiomediastinal silhouette is borderline enlarged. Pneumomediastinum is again noted. The patient is status post median sternotomy, with evidence of prior CABG. No acute osseous abnormalities are seen. Extensive soft tissue air is noted throughout the chest wall and within the neck.  IMPRESSION: 1. Endotracheal tube seen ending 2 cm above the carina. 2. Right apical chest tube noted; previously noted trace right-sided pneumothorax is not well characterized on radiograph. 3. Underlying airspace opacities may reflect atelectasis. Borderline cardiomegaly noted. 4. Pneumomediastinum again noted. 5. Extensive soft tissue air again seen throughout the chest wall and within the neck.   Electronically Signed   By: Roanna Raider M.D.   On: 04/08/2013 05:49   Dg Chest Portable 1 View  04/20/2013   CLINICAL DATA:  ETT placement  EXAM: PORTABLE CHEST - 1 VIEW  COMPARISON:  04/08/2013  FINDINGS: Endotracheal tube terminates 2.7 cm above the carina.  Low lung volumes. Possible tiny right pneumothorax on prior chest CT is not definitely evident on chest radiograph. Known pneumomediastinum is better visualized on CT.  Heart is top-normal in size.  Postsurgical changes related to prior CABG.  Extensive subcutaneous emphysema in the bilateral neck a and chest wall, right greater than left. This is significantly increased from prior CT.  Prior suspected pneumoperitoneum is not well visualized on the study.  IMPRESSION: Endotracheal tube terminates 2.7 cm above the carina.  Additional findings are better visualized on CT.   Electronically Signed   By: Charline Bills M.D.   On: 04/19/2013 01:42   Dg Chest Port 1 View  04/03/2013   CLINICAL DATA:  Abdominal pain, nausea/ vomiting, lethargy  EXAM: PORTABLE CHEST - 1 VIEW  COMPARISON:  None.  FINDINGS: Low lung volumes.  No focal consolidation.  Eventration of the right hemidiaphragm.  Mild cardiomegaly. Postsurgical changes related to prior CABG.  Possible lucency beneath the right liver, free air not excluded. Correlate with pending CT abdomen/pelvis.  Suspected subcutaneous emphysema in the neck, right greater than left.  IMPRESSION: Low lung volumes.  No evidence of acute cardiopulmonary disease.  Possible lucency beneath the right liver, free air not excluded. Correlate with pending CT abdomen/pelvis.  Suspected subcutaneous emphysema in the neck, right greater than left.  Critical value/emergent results were called by telephone at the time of interpretation on 04-28-13 at 11:31 PM to Dr. Bebe Shaggy, who verbally acknowledged these results.   Electronically Signed   By: Charline Bills M.D.   On: 2013-04-28 23:32   Dg Abd Portable 2v  2013-04-28   CLINICAL DATA:  Abdominal pain, nausea/vomiting, lethargy  EXAM: PORTABLE ABDOMEN - 2 VIEW  COMPARISON:  CT abdomen pelvis dated 04/08/2013  FINDINGS: Lucency beneath the right liver is worrisome for pneumoperitoneum. Suspected Rigler's sign in the right mid abdomen.  Moderate gaseous distention with debris.  Surgical clips in the left upper abdomen.  Diffuse gaseous distention of small and large bowel.  IMPRESSION: Findings suspicious for pneumoperitoneum.  Correlate with pending CT abdomen pelvis.  Critical value/emergent results were called by telephone at the time of interpretation on 04-28-2013 at 11:32 PM to Dr. Bebe Shaggy, who verbally acknowledged these results.   Electronically Signed   By: Charline Bills M.D.   On: 2013/04/28 23:34     CXR:  IMPRESSION:  1. Endotracheal tube seen ending 2 cm above the carina.  2. Right apical chest tube noted; previously noted trace right-sided  pneumothorax is not well characterized on radiograph.  3. Underlying airspace opacities may reflect atelectasis. Borderline  cardiomegaly noted.  4. Pneumomediastinum again noted.  5. Extensive soft tissue air again seen throughout the chest wall  and within the neck   ASSESSMENT / PLAN:  PULMONARY A: 1) Acute hypoxemic respiratory failure secondary to shock  P:   - Mechanical ventilation   - PRVC, Vt: 8cc/kg, PEEP: 5, RR: 35, FiO2: 100% and adjust to keep O2 sat > 94%   - VAP prevention order set   - Daily awakening and SBT as indicated   CARDIOVASCULAR A:  1) Shock secondary to massive gastric perforation.  2) Severe metabolic acidosis P:  - Continue IVF resuscitation - Now on norepinephrine and phenylephrine - We will start vasopressin - Stress dose steroids.  RENAL A:   1) Acute renal failure secondary to shock P:   - Resuscitation as above - We will follow chemestry  GASTROINTESTINAL A:   1) Massive gastric perforation. Post sleeve gastrectomy, wound vac P:   - Protonix IV - Management per primary team.   HEMATOLOGIC A:  1) No issues P:  - Will follow CBC - Transfuse for Hgb <10 given history of CAD.  INFECTIOUS A:   1) Gastric perforation. Septic shock P:   - Zosyn  ENDOCRINE A:   1) Hyperglycemia P:   - Novolog sliding scale  NEUROLOGIC A:   1) Intubated, unresponsive after surgery P:   - Fentanyl drip - PRN Versed - Family aware of poor prognosis  TODAY'S SUMMARY:   I have personally obtained a  history, examined the patient, evaluated laboratory and imaging results, formulated the assessment and plan and placed orders. CRITICAL CARE: The patient is critically ill with multiple organ systems failure and requires  high complexity decision making for assessment and support, frequent evaluation and titration of therapies, application of advanced monitoring technologies and extensive interpretation of multiple databases. Critical Care Time devoted to patient care services described in this note is 60 minutes.   Overton Mam, MD Pulmonary and Critical Care Medicine [facility] Pager: [phone]  May 17, 2013, 6:36 AM

## 2013-04-27 NOTE — Anesthesia Postprocedure Evaluation (Signed)
  Anesthesia Post-op Note  Patient: Marcelina MorelAlyne D Pitstick  Procedure(s) Performed: Procedure(s) (LRB): EXPLORATORY LAPAROTOMY with wound vac application sleeve gastrectomy  (N/A)  Patient Location: ICU Anesthesia Type: General  Level of Consciousness: asleep  Airway and Oxygen Therapy: ventilator Post-op Pain: mild  Post-op Assessment: Post-op Vital signs reviewed, Patient's Cardiovascular Status- hypotension on maximum pressors, Intubated on ventilator  Last Vitals:  Filed Vitals:   04/17/2013 0520  BP: 69/35  Pulse: 71  Temp:   Resp: 22    Post-op Vital Signs: hypotensive massive septic shock   Complications: No apparent anesthesia complications

## 2013-04-27 NOTE — Progress Notes (Addendum)
I updated the patient's status to the extended family at bedside.  Recommendations were made.  Questions were answered.  Family wished DNR w no more aggressive Tx such as CPR, cardioversion.  The family expressed understanding & appreciation.

## 2013-04-27 NOTE — ED Provider Notes (Signed)
[facility] here for transfer CXR does not show any large PTX after intubation Hypoxia improved after intubation SBP is >100 Family informed of critical nature of illness.  Pt will transferred   [name] W Tomeca Helm, MD 2014-01-08 [phone]

## 2013-04-27 NOTE — Op Note (Signed)
04/23/2013 - 04/06/2013  4:42 AM  PATIENT:  [name]  80 y.o. female  Patient Care Team: Catalina PizzaZach Hall, MD as PCP - General (Internal Medicine) Malissa HippoNajeeb U Rehman, MD as Consulting Physician (Gastroenterology) Tonny BollmanMichael Cooper, MD as Consulting Physician (Cardiology)  PRE-OPERATIVE DIAGNOSIS:  pneumoperitoneum  POST-OPERATIVE DIAGNOSIS:  massive gastric perforation   PROCEDURE:  Procedure(s): EXPLORATORY LAPAROTOMY LYSIS OF ADHESIONS SLEEVE GASTRECTOMY APPLICATION OF INTRAPERITONEAL WOUND VAC  SURGEON:  Surgeon(s): Ardeth SportsmanSteven C. Xianna Siverling, MD Almond LintFaera Byerly, MD - Asst  ANESTHESIA:   general  EBL:  Total I/O In: 3000 [I.V.:3000] Out: 100 [Urine:100]  Delay start of Pharmacological VTE agent (>24hrs) due to surgical blood loss or risk of bleeding:  no  DRAINS: none   SPECIMEN:  Source of Specimen:  Lateral stomach (greater curvature)  DISPOSITION OF SPECIMEN:  PATHOLOGY  COUNTS:  YES  PLAN OF CARE: Admit to inpatient   PATIENT DISPOSITION:  ICU - intubated and critically ill.  INDICATION: Active elderly woman with sudden onset of severe abdominal pain.  Found to have free air.  Suspicion of massive gastric perforation.  Prior fundoplication as well as lysis of adhesions.  Intubated going into shock.  Discuss with the family.  Risk of death imminent without surgery.  Risks of gas still very high with surgery.  They wished give her a chance:  The anatomy & physiology of the digestive tract was discussed.  The pathophysiology of perforation was discussed.  Differential diagnosis such as perforated ulcer or colon, etc was discussed.   Natural history risks without surgery such as death was discussed.  I recommended abdominal exploration to diagnose & treat the source of the problem.  Laparoscopic & open techniques were discussed.   Risks such as bleeding, infection, abscess, leak, reoperation, bowel resection, possible ostomy, hernia, heart attack, death, and other risks were discussed.    The risks of no intervention will lead to serious problems including death.   I expressed a good likelihood that surgery will address the problem.    Goals of post-operative recovery were discussed as well.  We will work to minimize complications although risks in an emergent setting are high.   Questions were answered.  The patient expressed understanding & wishes to proceed with surgery.       OR FINDINGS: 7 cm linear perforation of posterior gastric wall near the upper body, 5 cm distal to the angle of His.  Fundoplication mostly intact & not involved.  Lateral half of stomach and ischemic/necrotic.  Somewhat preserved along the lesser curve.  Very dense adhesions in the abdomen.  Evidence of ischemia of intestine and colon most likely due to severe shock and need for pressors.  No perforation elsewhere.  Supraumbilical was Swiss-cheese-type ventral hernias with 1-2 cm defects with transverse colon within it.  No evidence of incarceration or strangulation air.  DESCRIPTION:   Informed consent was confirmed.   The patient came into the room intubated.  She received IV antibiotics and underwent general anesthesia without any difficulty. The patient was positioned supine. Foley catheter was already in place. 2 decent IVs In place.  SCDs were active during the entire case.  The abdomen was prepped and draped in a sterile fashion.  A surgical timeout confirmed our plan.  I entered the abdomen through a midline incision.  I encountered some soft colon in some Swiss cheese hernias.  I stayed away from them.  I encountered a large pocket of gas foul smelling contents.  The patient had very  dense adhesions to the anterior nominal wall and to colon and intestine.  Eventually freed everything off the anterior abdominal wall.  i freed the stomach off the liver.   I scooped out about 500 mL of chewed up foul-smelling contents.   There was an obvious giant perforation on the posterior gastric wall near the  proximal greater curve.  The greater omentum was rather ischemic.  I freed the greater omentum off the greater curvature of the stomach and followed up to better fully mobilized the lateral stomach.  The short gastrics had been taken on the most cephalad quarter of the greater curvature.  I found the obvious 7cm linear giant perforation on the posterior wall as noted above.  The lesser curve was less ischemic.  To avoid a complete gastrectomy, we decided to proceed with a sleeve gastrectomy.  I started at the mid antrum on the greater curve side and began to transect parallel to the lesser curvature of the stomach using several 75mm GIA loads.  Left about a 2-3 cm sleeve.  Going cephalad lateral to the fundoplication and was able to get 1 cm margins around the perforation cephalad.  There is some serosal thinning superiorly.  So, we resected a little bit more in the proximal fundus.  The stomach wall was ischemic but looked much better overall.  We did copious irrigation intermittently throughout the steps.  Irrigated more.  She had obvious gas along her chest wall as well as the left retroperitoneum.  I did free some adhesions of the transverse colon and small bowel to get a better sense of anatomy.  There was some evidence of ischemia of the colon and see her in some loops of small bowel but no definite necrosis.    At this point we felt we should stop and give the patient a chance to resuscitate in the ICU for a damage-control intervention.  After irrigating more to a total of 15 L of irrigation, we placed an abdominal wound VAC in the peritoneal cavity and laid in ellipsoid sponge and the covering over it.  The patient was in shock throughout the entire case.  Was on Levophed near the end of the case.  Did make it to the intensive care unit.  Had a long discussion with the patient's family.  I cautioned them on the severe room prognosis.  They are appreciative of our help to give her a chance.  I discussed  with Dr. Milana Obey with intensive care as well so they can help [place].  If she survives the next 24-48hours, we plan a second laparotomy to see if ischemia has worsened or improved, place feeding jejunostomy, and see if anything else needs to happen.    I updated the patient's status to the family.  Recommendations were made.  Questions were answered.  At this point, they are debating on full vs. limited code.  I think is reasonable to be aggressive for the first 48 hours, not beyond that.  I asked him to discuss with the intensive care unit nurses and doctors as well to help make a decision.  The family expressed understanding & appreciation.

## 2013-04-27 NOTE — Progress Notes (Signed)
ANTIBIOTIC CONSULT NOTE - INITIAL  Pharmacy Consult for Zosyn Indication: Gastric perforation  Allergies  Allergen Reactions  . Codeine Nausea And Vomiting  . Tramadol Nausea And Vomiting  . Ultracet [Tramadol-Acetaminophen] Nausea And Vomiting    Patient Measurements: Height: 5\' 7"  (170.2 cm) Weight: 135 lb (61.236 kg) IBW/kg (Calculated) : 61.6   Vital Signs: Temp: 96.7 F (35.9 C) (02/28 0210) Temp src: Rectal (02/28 0210) BP: 95/33 mmHg (02/28 0605) Pulse Rate: 79 (02/28 0605) Intake/Output from previous day: 02/27 0701 - 02/28 0700 In: 7600 [I.V.:4600; IV Piggyback:3000] Out: 200 [Urine:100; Blood:100] Intake/Output from this shift: Total I/O In: 7600 [I.V.:4600; IV Piggyback:3000] Out: 200 [Urine:100; Blood:100]  Labs:  Recent Labs  January 20, 2014 2225  WBC 14.2*  HGB 13.1  PLT 289  CREATININE 1.22*   Estimated Creatinine Clearance: 31.4 ml/min (by C-G formula based on Cr of 1.22). No results found for this basename: VANCOTROUGH, VANCOPEAK, VANCORANDOM, GENTTROUGH, GENTPEAK, GENTRANDOM, TOBRATROUGH, TOBRAPEAK, TOBRARND, AMIKACINPEAK, AMIKACINTROU, AMIKACIN,  in the last 72 hours   Microbiology: No results found for this or any previous visit (from the past 720 hour(s)).  Medical History: Past Medical History  Diagnosis Date  . CAD (coronary artery disease)     a.  s/p MI;  b. s/p CABG x 4 - LIMA->LAD, VG->DIAG, VG->PLA, VG->PDA;  c. Cath 05/2011 - LM 50-60%,  severe LAD/RCA dzs,  LIMA atretic, VG's patent;  d. Myoview 05/2005 (done after cath) EF 72%, no ischemia, fixed ant thinning.  . Carotid artery disease   . Paroxysmal a-fib     a. no longer on coumadin 2/2 h/o falls.  Marland Kitchen. HTN (hypertension)   . Hyperlipidemia   . Arthropathy, unspecified, other specified sites   . Sacroiliac joint dysfunction   . Spinal stenosis   . Anemia     iron deficiency  . Hypercalcemia   . Osteoarthrosis, unspecified whether generalized or localized, ankle and foot   .  Tibialis tendinitis   . Benign paroxysmal positional vertigo   . Prerenal azotemia     mild  . Anemia, macrocytic   . Raynaud's syndrome   . Impingement syndrome of shoulder region     unspecified area  . Osteoporosis, unspecified   . Shoulder pain, bilateral   . Chronic low back pain   . Arthritis   . Constipation   . PUD (peptic ulcer disease)   . Depression   . Anxiety   . Allergic rhinitis   . Fatigue   . ISCHEMIC COLITIS 09/24/2006    Qualifier: Diagnosis of  By: Julian ReilGardner LPN, Sherrie    . Nausea with vomiting 01/13/2008    Qualifier: Diagnosis of  By: Erby PianFerreira MD, Remi Haggardornelius    . Diverticulosis of colon 2008     Colonoscopy     Medications:  Scheduled:  . sodium chloride  1,000 mL Intravenous Once  . antiseptic oral rinse  15 mL Mouth Rinse QID  . chlorhexidine  1 application Topical Once  . [START ON 04/30/2013] chlorhexidine  1 application Topical Once  . chlorhexidine  15 mL Mouth Rinse BID  . diphenhydrAMINE      . fluconazole (DIFLUCAN) IV  200 mg Intravenous Q24H  . lactated ringers  1,000 mL Intravenous Q1 Hr x 2  . lidocaine (cardiac) 100 mg/885ml      . lip balm  1 application Topical BID  . pantoprazole (PROTONIX) IV  40 mg Intravenous Q12H  . piperacillin-tazobactam (ZOSYN)  IV  3.375 g Intravenous 3 times per day  .  propofol      . rocuronium      . sodium bicarbonate       Infusions:  . sodium chloride    . lactated ringers    . norepinephrine (LEVOPHED) Adult infusion 40 mcg/min (04-30-2013 0626)  . phenylephrine (NEO-SYNEPHRINE) Adult infusion 150 mcg/min (April 30, 2013 1610)   Assessment: 80 yo c/o abdominal pain found to have massive gastric perforation.  Zosyn per Rx.  Goal of Therapy:  Treat infection  Plan:   Zosyn 3.375 Gm IV q8h EI infusion  F/U SCr/cultures as needed  Susanne Greenhouse R 04-30-13,6:37 AM

## 2013-04-27 NOTE — Procedures (Signed)
Arterial Catheter Insertion Procedure Note [name] [id number] 31-Jan-1924  Procedure: Insertion of Arterial Catheter  Indications: Blood pressure monitoring and Frequent blood sampling  Procedure Details Consent: Risks of procedure as well as the alternatives and risks of each were explained to the (patient/caregiver).  Consent for procedure obtained. Time Out: Verified patient identification, verified procedure, site/side was marked, verified correct patient position, special equipment/implants available, medications/allergies/relevent history reviewed, required imaging and test results available.  Performed  Maximum sterile technique was used including antiseptics, cap, gloves, gown, hand hygiene, mask and sheet. Skin prep: Chlorhexidine; local anesthetic administered 22 gauge catheter was inserted into left radial artery using the Seldinger technique. Procedure done under direct visualization with ultrasound.  Evaluation Blood flow good; BP tracing good. Complications: No apparent complications.   Overton MamSIQUEIROS, Demeshia Sherburne 04/02/2013

## 2013-04-27 NOTE — ED Notes (Signed)
Patient is pain free after medication administration. Patient is drowsy but answers questions appropriately. Moved patient to TR 2 in ED for closer monitoring. Gave patient report to Venetia NightSharon Moore, RN who will assume care at this point.

## 2013-04-27 NOTE — Procedures (Signed)
Central Venous Catheter Insertion Procedure Note Marcelina Morellyne D Bally [id number] [birth year]  Procedure: Insertion of Central Venous Catheter Indications: Assessment of intravascular volume, Drug and/or fluid administration and Frequent blood sampling  Procedure Details Consent: Risks of procedure as well as the alternatives and risks of each were explained to the (patient/caregiver).  Consent for procedure obtained. Time Out: Verified patient identification, verified procedure, site/side was marked, verified correct patient position, special equipment/implants available, medications/allergies/relevent history reviewed, required imaging and test results available.  Performed  Maximum sterile technique was used including antiseptics, cap, gloves, gown, hand hygiene, mask and sheet. Skin prep: Chlorhexidine; local anesthetic administered A antimicrobial bonded/coated triple lumen catheter was placed in the left internal jugular vein using the Seldinger technique. Procedure done under direct visualization with ultrasound.   Evaluation Blood flow good Complications: No apparent complications Patient did tolerate procedure well. Chest X-ray ordered to verify placement.  CXR: pending.  [place]ton MamSIQUEIROS, Dynasty Holquin 2013/05/01, 6:34 AM

## 2013-04-27 NOTE — Progress Notes (Addendum)
Patient expired at [id number].  Family at bedside emotional supporting patient at time of death.  This Nurse provided support to family during dying process. Dr. Michaell CowingGross notified of death and came to room to support family as well. Pronouncement performed by this RN and then Dr. Michaell CowingGross. Ok EdwardsSheila Haroun Cotham, RN

## 2013-04-27 NOTE — Discharge Summary (Signed)
Physician Discharge Summary  Patient ID: [name] MRN: [id number] DOB/AGE: 1923/10/07 80 y.o.  Admit date: 04/01/2013 Discharge date: 22-May-2013  Admission Diagnoses:  Discharge Diagnoses:  Active Problems:   Pneumoperitoneum of unknown etiology, probable gastric perforation   Mediastinal emphysema (pneumomediastinum)   Acute gastric perforation   Discharged Condition: Dead  Hospital Course: Patient with peritonitis & perforation came to [facility] ED & then transferred to [facility] ED in severe shock.  Underwent emergent surgery for large gastric perforation.  Pt went into [facility] rapidly.  Limited code & DNR.  Expired 5 hours after surgery in ICU with ICU RN & family at bedside.  Family appreciated aggressive care to try to save her life.  Consults: pulmonary/intensive care  Significant Diagnostic Studies:   Results for orders placed during the hospital encounter of 04/02/2013 (from the past 72 hour(s))  CBC WITH DIFFERENTIAL     Status: Abnormal   Collection Time    04/19/2013 10:25 PM      Result Value Ref Range   WBC 14.2 (*) 4.0 - 10.5 K/uL   RBC 4.21  3.87 - 5.11 MIL/uL   Hemoglobin 13.1  12.0 - 15.0 g/dL   HCT 41.0  36.0 - 46.0 %   MCV 97.4  78.0 - 100.0 fL   MCH 31.1  26.0 - 34.0 pg   MCHC 32.0  30.0 - 36.0 g/dL   RDW 16.3 (*) 11.5 - 15.5 %   Platelets 289  150 - 400 K/uL   Neutrophils Relative % 83 (*) 43 - 77 %   Neutro Abs 11.7 (*) 1.7 - 7.7 K/uL   Lymphocytes Relative 10 (*) 12 - 46 %   Lymphs Abs 1.5  0.7 - 4.0 K/uL   Monocytes Relative 5  3 - 12 %   Monocytes Absolute 0.7  0.1 - 1.0 K/uL   Eosinophils Relative 2  0 - 5 %   Eosinophils Absolute 0.2  0.0 - 0.7 K/uL   Basophils Relative 0  0 - 1 %   Basophils Absolute 0.1  0.0 - 0.1 K/uL  COMPREHENSIVE METABOLIC PANEL     Status: Abnormal   Collection Time    04/11/2013 10:25 PM      Result Value Ref Range   Sodium 136 (*) 137 - 147 mEq/L   Potassium 4.7  3.7 - 5.3 mEq/L   Chloride 94 (*) 96 - 112 mEq/L   CO2  26  19 - 32 mEq/L   Glucose, Bld 202 (*) 70 - 99 mg/dL   BUN 26 (*) 6 - 23 mg/dL   Creatinine, Ser 1.22 (*) 0.50 - 1.10 mg/dL   Calcium 9.4  8.4 - 10.5 mg/dL   Total Protein 8.6 (*) 6.0 - 8.3 g/dL   Albumin 4.2  3.5 - 5.2 g/dL   AST 88 (*) 0 - 37 U/L   ALT 33  0 - 35 U/L   Alkaline Phosphatase 100  39 - 117 U/L   Total Bilirubin 0.6  0.3 - 1.2 mg/dL   GFR calc non Af Amer 39 (*) >90 mL/min   GFR calc Af Amer 45 (*) >90 mL/min   Comment: (NOTE)     The eGFR has been calculated using the CKD EPI equation.     This calculation has not been validated in all clinical situations.     eGFR's persistently <90 mL/min signify possible Chronic Kidney     Disease.  LIPASE, BLOOD     Status: Abnormal  Collection Time    04/23/2013 10:25 PM      Result Value Ref Range   Lipase >3000 (*) 11 - 59 U/L  TROPONIN I     Status: None   Collection Time    04/07/2013 10:25 PM      Result Value Ref Range   Troponin I <0.30  <0.30 ng/mL   Comment:            Due to the release kinetics of cTnI,     a negative result within the first hours     of the onset of symptoms does not rule out     myocardial infarction with certainty.     If myocardial infarction is still suspected,     repeat the test at appropriate intervals.  LACTIC ACID, PLASMA     Status: Abnormal   Collection Time    04/05/2013 10:56 PM      Result Value Ref Range   Lactic Acid, Venous 3.8 (*) 0.5 - 2.2 mmol/L  BLOOD GAS, ARTERIAL     Status: Abnormal   Collection Time    2013-05-06  2:10 AM      Result Value Ref Range   FIO2 1.00     Delivery systems VENTILATOR     Mode PRESSURE REGULATED VOLUME CONTROL     VT 500     Rate 16     Peep/cpap 5.0     pH, Arterial 7.151 (*) 7.350 - 7.450   Comment: CRITICAL RESULT CALLED TO, READ BACK BY AND VERIFIED WITH:      Delora Fuel, MD AT 2725 BY TABATHA KNAPP, RRT, RCP ON 05/06/13   pCO2 arterial 63.1 (*) 35.0 - 45.0 mmHg   Comment: CRITICAL RESULT CALLED TO, READ BACK BY AND VERIFIED WITH:       Delora Fuel, MD AT 3664 BY TABATHA KNAPP, RRT, RCP ON 06-May-2013   pO2, Arterial 0.0 (*) 80.0 - 100.0 mmHg   Comment: CRITICAL RESULT CALLED TO, READ BACK BY AND VERIFIED WITH:      Delora Fuel, MD AT 4034 BY TABATHA KNAPP, RRT, RCP ON May 06, 2013. VALUE BELOW REPORTABLE RANGE.   Bicarbonate 21.5  20.0 - 24.0 mEq/L   TCO2 21.3  0 - 100 mmol/L   Acid-base deficit 8.0 (*) 0.0 - 2.0 mmol/L   O2 Saturation 28.3     Patient temperature 96.7     Collection site BRACHIAL ARTERY     Drawn by [id number]     Sample type       Value:  VENOUS BLOOD SAMPLE OBTAINED WITH ARTERIAL ATTEMPT  BLOOD GAS, ARTERIAL     Status: Abnormal   Collection Time    05-06-13  5:16 AM      Result Value Ref Range   FIO2 1.00     Delivery systems VENTILATOR     Mode PRESSURE REGULATED VOLUME CONTROL     VT 500     Rate 22.0     Peep/cpap 5.0     pH, Arterial 7.110 (*) 7.350 - 7.450   Comment: CRITICAL RESULT CALLED TO, READ BACK BY AND VERIFIED WITH:      BROOKE WHITE, RN AT 0525 BY TABATHA KNAPP, RRT, RCP ON 05/06/2013   pCO2 arterial 41.7  35.0 - 45.0 mmHg   pO2, Arterial 69.9 (*) 80.0 - 100.0 mmHg   Bicarbonate 12.7 (*) 20.0 - 24.0 mEq/L   TCO2 13.2  0 - 100 mmol/L   Acid-base deficit 15.1 (*) 0.0 -  2.0 mmol/L   O2 Saturation 91.0     Patient temperature 98.6     Collection site  RIGHT BRACHIAL     Drawn by  Caro Hight, RRT     Sample type ARTERIAL DRAW    BLOOD GAS, ARTERIAL     Status: Abnormal   Collection Time    May 02, 2013  6:35 AM      Result Value Ref Range   FIO2 1.00     Delivery systems VENTILATOR     Mode PRESSURE REGULATED VOLUME CONTROL     VT 500     Rate 35.0     Peep/cpap 5.0     pH, Arterial 7.234 (*) 7.350 - 7.450   pCO2 arterial 32.1 (*) 35.0 - 45.0 mmHg   pO2, Arterial 49.7 (*) 80.0 - 100.0 mmHg   Bicarbonate 13.1 (*) 20.0 - 24.0 mEq/L   TCO2 13.4  0 - 100 mmol/L   Acid-base deficit 12.9 (*) 0.0 - 2.0 mmol/L   O2 Saturation 85.0     Patient temperature 98.6     Collection site  ARTERIAL LINE     Drawn by [id number]     Sample type ARTERIAL DRAW    MRSA PCR SCREENING     Status: None   Collection Time    05/02/13  6:58 AM      Result Value Ref Range   MRSA by PCR NEGATIVE  NEGATIVE   Comment:            The GeneXpert MRSA Assay (FDA     approved for NASAL specimens     only), is one component of a     comprehensive MRSA colonization     surveillance program. It is not     intended to diagnose MRSA     infection nor to guide or     monitor treatment for     MRSA infections.   Ct Chest W Contrast  02-May-2013   CLINICAL DATA:  Shaft epigastric pain, nausea. Prior cholecystectomy, appendectomy, hiatal hernia repair, and Roux-en-Y.  EXAM: CT CHEST, ABDOMEN, AND PELVIS WITH CONTRAST  TECHNIQUE: Multidetector CT imaging of the chest, abdomen and pelvis was performed following the standard protocol during bolus administration of intravenous contrast.  CONTRAST:  61mL OMNIPAQUE IOHEXOL 300 MG/ML SOLN, 178mL OMNIPAQUE IOHEXOL 300 MG/ML SOLN  COMPARISON:  Chest/abdominal radiographs dated 04/11/2013. CT abdomen pelvis dated 04/08/2013.  FINDINGS: CT CHEST FINDINGS  Extensive subcutaneous emphysema in the bilateral neck, bilateral chest wall, and right mid/lower back.  Moderate pneumomediastinum and possible trace pneumopericardium (series 3/ image 34).  Possible tiny right anterior pneumothorax (series 3/ image 28).  Mild patchy opacities in the bilateral lower lobes, likely atelectasis. Trace bilateral pleural effusions, left greater than right.  The heart is normal in size. Coronary atherosclerosis. Postsurgical changes related to prior CABG. Atherosclerotic calcifications of the aortic arch.  Fluid/debris within the lower esophagus.  Visualized osseous structures are within normal limits.  CT ABDOMEN AND PELVIS FINDINGS  Postsurgical changes at the GE junction related to Nissen fundoplication.  Suspected discontinuity/frank perforation involving the posterior aspect of the gastric  fundus (series 3/image 53; series 4/image 54). Associated gas and layering fluid/debris within an 18.8 x 11.8 x 19.9 cm extraluminal collection in the left mid abdomen (series 3/ image 67). Associated large volume mesenteric gas within the right mid abdomen (series 3/ image 61), moderate bilateral retroperitoneal gas (series 2/ image 79), moderate periportal gas, and portal venous gas.  Liver is  otherwise unremarkable. Spleen, pancreas, and adrenal glands are grossly unremarkable.  Status post cholecystectomy.  Bilateral renal atrophy with scattered probable tiny renal cysts. No hydronephrosis.  Multiple mildly prominent loops of small and large bowel, favored to reflect secondary adynamic ileus.  Multiple small midline/paramidline ventral hernias.  No abdominopelvic ascites.  No suspicious abdominopelvic lymphadenopathy.  Uterus and bilateral ovaries are unremarkable.  Bladder is decompressed with indwelling Foley catheter.  Degenerative changes of the lumbar spine, most prominent at L1-2.  IMPRESSION: Suspected discontinuity/frank perforation involving the posterior aspect of the gastric fundus. Associated gas and layering debris within a 19.9 cm extraluminal collection in the left mid abdomen.  Associated pneumoperitoneum, retroperitoneum, and portal venous gas. Associated pneumomediastinum, possible trace pneumopericardium, and possible tiny right anterior pneumothorax. Extensive subcutaneous emphysema involving the bilateral neck, bilateral chest, and right back.  Multiple mildly prominent loops of small and large bowel, favored to reflect secondary adynamic ileus.  Postsurgical changes at the GE junction related to Nissen fundoplication. Fluid within the distal esophagus.  Additional ancillary findings as above.  Critical value/emergent results were called by telephone at the time of interpretation on 2013-05-07 at 12:51 AM to Dr. Ripley Fraise, who verbally acknowledged these results. Findings were also  discussed with Dr. Johney Maine on May 07, 2013 at 1248 hr.   Electronically Signed   By: Julian Hy M.D.   On: 07-May-2013 01:08   Ct Abdomen Pelvis W Contrast  05/07/13   CLINICAL DATA:  Shaft epigastric pain, nausea. Prior cholecystectomy, appendectomy, hiatal hernia repair, and Roux-en-Y.  EXAM: CT CHEST, ABDOMEN, AND PELVIS WITH CONTRAST  TECHNIQUE: Multidetector CT imaging of the chest, abdomen and pelvis was performed following the standard protocol during bolus administration of intravenous contrast.  CONTRAST:  58mL OMNIPAQUE IOHEXOL 300 MG/ML SOLN, 154mL OMNIPAQUE IOHEXOL 300 MG/ML SOLN  COMPARISON:  Chest/abdominal radiographs dated 03/30/2013. CT abdomen pelvis dated 04/08/2013.  FINDINGS: CT CHEST FINDINGS  Extensive subcutaneous emphysema in the bilateral neck, bilateral chest wall, and right mid/lower back.  Moderate pneumomediastinum and possible trace pneumopericardium (series 3/ image 34).  Possible tiny right anterior pneumothorax (series 3/ image 28).  Mild patchy opacities in the bilateral lower lobes, likely atelectasis. Trace bilateral pleural effusions, left greater than right.  The heart is normal in size. Coronary atherosclerosis. Postsurgical changes related to prior CABG. Atherosclerotic calcifications of the aortic arch.  Fluid/debris within the lower esophagus.  Visualized osseous structures are within normal limits.  CT ABDOMEN AND PELVIS FINDINGS  Postsurgical changes at the GE junction related to Nissen fundoplication.  Suspected discontinuity/frank perforation involving the posterior aspect of the gastric fundus (series 3/image 53; series 4/image 54). Associated gas and layering fluid/debris within an 18.8 x 11.8 x 19.9 cm extraluminal collection in the left mid abdomen (series 3/ image 67). Associated large volume mesenteric gas within the right mid abdomen (series 3/ image 61), moderate bilateral retroperitoneal gas (series 2/ image 79), moderate periportal gas, and portal  venous gas.  Liver is otherwise unremarkable. Spleen, pancreas, and adrenal glands are grossly unremarkable.  Status post cholecystectomy.  Bilateral renal atrophy with scattered probable tiny renal cysts. No hydronephrosis.  Multiple mildly prominent loops of small and large bowel, favored to reflect secondary adynamic ileus.  Multiple small midline/paramidline ventral hernias.  No abdominopelvic ascites.  No suspicious abdominopelvic lymphadenopathy.  Uterus and bilateral ovaries are unremarkable.  Bladder is decompressed with indwelling Foley catheter.  Degenerative changes of the lumbar spine, most prominent at L1-2.  IMPRESSION: Suspected discontinuity/frank perforation involving the posterior  aspect of the gastric fundus. Associated gas and layering debris within a 19.9 cm extraluminal collection in the left mid abdomen.  Associated pneumoperitoneum, retroperitoneum, and portal venous gas. Associated pneumomediastinum, possible trace pneumopericardium, and possible tiny right anterior pneumothorax. Extensive subcutaneous emphysema involving the bilateral neck, bilateral chest, and right back.  Multiple mildly prominent loops of small and large bowel, favored to reflect secondary adynamic ileus.  Postsurgical changes at the GE junction related to Nissen fundoplication. Fluid within the distal esophagus.  Additional ancillary findings as above.  Critical value/emergent results were called by telephone at the time of interpretation on May 11, 2013 at 12:51 AM to Dr. Ripley Fraise, who verbally acknowledged these results. Findings were also discussed with Dr. Johney Maine on 05/11/2013 at 1248 hr.   Electronically Signed   By: Julian Hy M.D.   On: 2013-05-11 01:08   Dg Chest Port 1 View  2013-05-11   CLINICAL DATA:  Central line placement  EXAM: PORTABLE CHEST - 1 VIEW  COMPARISON:  Earlier film of the same day  FINDINGS: Interval placement of a left IJ central venous catheter, tip in the distal SVC. No  definite pneumothorax. There is pneumomediastinum and extensive bilateral subcutaneous emphysema as before. Endotracheal tube, and nasogastric tube remain in place. Previous CABG. Multiple vascular clips in the left upper abdomen. Heart size upper limits normal. . Patchy airspace consolidation in the lingula has slightly increased. No definite effusion.  IMPRESSION: 1. Left IJ central line to the distal SVC, no definite pneumothorax. 2. Increasing focal consolidation/atelectasis in the lingula. 3. Pneumomediastinum and extensive subcutaneous emphysema as before.   Electronically Signed   By: Arne Cleveland M.D.   On: 05-11-2013 08:13   Portable Chest Xray  11-May-2013   CLINICAL DATA:  Endotracheal tube placement.  EXAM: PORTABLE CHEST - 1 VIEW  COMPARISON:  Chest radiograph performed earlier today at 12:52 a.m.  FINDINGS: The patient's endotracheal tube is seen ending 2 cm above the carina. The enteric tube is noted extending below the diaphragm. A right apical chest tube is noted.  The lungs are difficult to fully assess due to the extent of soft tissue air throughout the chest wall. The trace right-sided pneumothorax noted on CT is not well characterized on radiograph. Underlying airspace opacities may reflect atelectasis. No definite pleural effusion is identified.  The cardiomediastinal silhouette is borderline enlarged. Pneumomediastinum is again noted. The patient is status post median sternotomy, with evidence of prior CABG. No acute osseous abnormalities are seen. Extensive soft tissue air is noted throughout the chest wall and within the neck.  IMPRESSION: 1. Endotracheal tube seen ending 2 cm above the carina. 2. Right apical chest tube noted; previously noted trace right-sided pneumothorax is not well characterized on radiograph. 3. Underlying airspace opacities may reflect atelectasis. Borderline cardiomegaly noted. 4. Pneumomediastinum again noted. 5. Extensive soft tissue air again seen throughout  the chest wall and within the neck.   Electronically Signed   By: Garald Balding M.D.   On: 05-11-2013 05:49   Dg Chest Portable 1 View  2013/05/11   CLINICAL DATA:  ETT placement  EXAM: PORTABLE CHEST - 1 VIEW  COMPARISON:  04/06/2013  FINDINGS: Endotracheal tube terminates 2.7 cm above the carina.  Low lung volumes. Possible tiny right pneumothorax on prior chest CT is not definitely evident on chest radiograph. Known pneumomediastinum is better visualized on CT.  Heart is top-normal in size. Postsurgical changes related to prior CABG.  Extensive subcutaneous emphysema in the bilateral neck a and  chest wall, right greater than left. This is significantly increased from prior CT.  Prior suspected pneumoperitoneum is not well visualized on the study.  IMPRESSION: Endotracheal tube terminates 2.7 cm above the carina.  Additional findings are better visualized on CT.   Electronically Signed   By: Julian Hy M.D.   On: 05/01/13 01:42   Dg Chest Port 1 View  04/18/2013   CLINICAL DATA:  Abdominal pain, nausea/ vomiting, lethargy  EXAM: PORTABLE CHEST - 1 VIEW  COMPARISON:  None.  FINDINGS: Low lung volumes.  No focal consolidation.  Eventration of the right hemidiaphragm.  Mild cardiomegaly. Postsurgical changes related to prior CABG.  Possible lucency beneath the right liver, free air not excluded. Correlate with pending CT abdomen/pelvis.  Suspected subcutaneous emphysema in the neck, right greater than left.  IMPRESSION: Low lung volumes.  No evidence of acute cardiopulmonary disease.  Possible lucency beneath the right liver, free air not excluded. Correlate with pending CT abdomen/pelvis.  Suspected subcutaneous emphysema in the neck, right greater than left.  Critical value/emergent results were called by telephone at the time of interpretation on 04/22/2013 at 11:31 PM to Dr. Christy Gentles, who verbally acknowledged these results.   Electronically Signed   By: Julian Hy M.D.   On: 04/16/2013  23:32   Dg Abd Portable 2v  04/02/2013   CLINICAL DATA:  Abdominal pain, nausea/vomiting, lethargy  EXAM: PORTABLE ABDOMEN - 2 VIEW  COMPARISON:  CT abdomen pelvis dated 04/08/2013  FINDINGS: Lucency beneath the right liver is worrisome for pneumoperitoneum. Suspected Rigler's sign in the right mid abdomen.  Moderate gaseous distention with debris.  Surgical clips in the left upper abdomen.  Diffuse gaseous distention of small and large bowel.  IMPRESSION: Findings suspicious for pneumoperitoneum. Correlate with pending CT abdomen pelvis.  Critical value/emergent results were called by telephone at the time of interpretation on 04/16/2013 at 11:32 PM to Dr. Christy Gentles, who verbally acknowledged these results.   Electronically Signed   By: Julian Hy M.D.   On: 04/24/2013 23:34    Treatments: surgery:   PROCEDURE: Procedure(s):  EXPLORATORY LAPAROTOMY  LYSIS OF ADHESIONS  SLEEVE GASTRECTOMY  APPLICATION OF INTRAPERITONEAL WOUND VAC  SURGEON: Surgeon(s):  Adin Hector, MD  Stark Klein, MD - Asst   Discharge Exam: Blood pressure 67/26, pulse 80, temperature 92.4 F (33.6 C), temperature source Rectal, resp. rate 35, height $RemoveBe'5\' 7"'xFbjTaSEk$  (1.702 m), weight 135 lb (61.236 kg), SpO2 95.00%.  expired  Disposition: 01-Home or Self Care     Medication List    ASK your doctor about these medications       ALPRAZolam 1 MG tablet  Commonly known as:  XANAX  Take 1.5 mg by mouth daily.     aspirin EC 81 MG tablet  Take 81 mg by mouth every morning.     Biotin 1000 MCG tablet  Take 1,000 mcg by mouth 2 (two) times daily.     citalopram 20 MG tablet  Commonly known as:  CELEXA  Take 20 mg by mouth every morning.     DULoxetine 30 MG capsule  Commonly known as:  CYMBALTA  Take 30 mg by mouth every morning.     fluticasone 50 MCG/ACT nasal spray  Commonly known as:  FLONASE  Place 1-2 sprays into the nose daily as needed for allergies or rhinitis.     ipratropium 0.06 % nasal  spray  Commonly known as:  ATROVENT  Place 3 sprays into both nostrils daily.  levothyroxine 88 MCG tablet  Commonly known as:  SYNTHROID, LEVOTHROID  Take 88 mcg by mouth daily before breakfast.     metoprolol succinate 25 MG 24 hr tablet  Commonly known as:  TOPROL-XL  Take 12.5 mg by mouth every morning.     nitroGLYCERIN 0.4 MG SL tablet  Commonly known as:  NITROSTAT  Place 1 tablet (0.4 mg total) under the tongue every 5 (five) minutes as needed for chest pain.     omeprazole 20 MG capsule  Commonly known as:  PRILOSEC  Take 1 capsule (20 mg total) by mouth daily.     OVER THE COUNTER MEDICATION  Take 1 tablet by mouth every morning. Hair, Skin, and Nails OTC supplement     promethazine 25 MG tablet  Commonly known as:  PHENERGAN  Take 25 mg by mouth every 6 (six) hours as needed for nausea or vomiting.     ramipril 5 MG capsule  Commonly known as:  ALTACE  Take 5 mg by mouth every morning.     senna 8.6 MG tablet  Commonly known as:  SENOKOT  Take 1 tablet by mouth daily as needed for constipation.         Signed: Kaziah Krizek C. 2013/05/03, 9:56 AM

## 2013-04-27 NOTE — ED Notes (Signed)
Bed: RESA Expected date:  Expected time:  Means of arrival:  Comments: [facility] from [facility]

## 2013-04-27 NOTE — ED Notes (Signed)
In CT, pt minimally responsive to commands, restless. O2 Sats decreasing on 4lnc. Dr Bebe Shaggywickline notified of same upon arrival back to ED. Family @ bedside and decision to intubate with family blessing @ [phone]

## 2013-04-27 NOTE — ED Provider Notes (Signed)
Just prior to transfer i was informed by nursing (tiffany at [facility]) that [facility] were [facility] However, there is still capability for surgical procedures D/w dr Preston Fleetingglick, EDP, he is aware of issue, [facility]   Joya Gaskinsonald W Vani Gunner, MD 03/31/2013 [phone]

## 2013-04-27 NOTE — ED Provider Notes (Addendum)
Pt with worsening mental status, hypoxia Pt intubated without difficulty Dr gross is aware of change in status, pt will still be transferred D/w dr Preston Fleetingglick[facility], aware of [facility]   INTUBATION Performed by: Joya GaskinsWICKLINE,Eliannah Hinde W  Required items: required blood products, implants, devices, and special equipment available Patient identity confirmed: provided demographic data and hospital-assigned identification number Time out: Immediately prior to procedure a "time out" was called to verify the correct patient, procedure, equipment, support staff and site/side marked as required.  Indications: altered mental status  Intubation method: direct laryngoscopy  Preoxygenation: BVM  Sedatives: Etomidate Paralytic: Succinylcholine  Tube Size: 7.5 cuffed  Post-procedure assessment: chest rise and ETCO2 monitor Breath sounds: equal and absent over the epigastrium Tube secured with: ETT holder Chest x-ray reviewed by me.    Patient tolerated the procedure well with no immediate complications.   CRITICAL CARE Performed by: Joya GaskinsWICKLINE,Diego Ulbricht W Total critical care time: 40 Critical care time was exclusive of separately billable procedures and treating other patients. Critical care was necessary to treat or prevent imminent or life-threatening deterioration. Critical care was time spent personally by me on the following activities: development of treatment plan with patient and/or surrogate as well as nursing, discussions with consultants, evaluation of patient's response to treatment, examination of patient, obtaining history from patient or surrogate, ordering and performing treatments and interventions, ordering and review of laboratory studies, ordering and review of radiographic studies, pulse oximetry and re-evaluation of patient's condition.   Joya Gaskinsonald W Jonny Dearden, MD 04/09/2013 [id number]  Joya Gaskinsonald W Nichalos Brenton, MD 04/12/2013 [phone]

## 2013-04-27 NOTE — ED Provider Notes (Signed)
Medical screening examination/treatment/procedure(s) were conducted as a shared visit with non-physician practitioner(s) and myself.  I personally evaluated the patient during the encounter.   EKG Interpretation   Date/Time:  Friday April 25 2013 22:09:21 EST Ventricular Rate:  80 PR Interval:  176 QRS Duration: 98 QT Interval:  424 QTC Calculation: 489 R Axis:   -19 Text Interpretation:  Sinus rhythm with Premature atrial complexes  Nonspecific ST abnormality Abnormal ECG When compared with ECG of  12-Mar-2012 11:53, Premature atrial complexes are now Present Nonspecific  T wave abnormality no longer evident in Inferior leads Nonspecific T wave  abnormality no longer evident in Anterior leads Confirmed by COOK  MD,  BRIAN ([id number]) on 02-02-14 10:57:50 PM       Pt very ill, became acutely worse soon after CT imaging and required intubation due to hypoxia and worsening mental status.  After discussion with local surgeon dr Lovell Sheehanjenkins he [facility] due to complexity of case, and family wanted [facility].  Dr Michaell CowingGross was on call who agreed to see patient in the ED.  Dr Preston FleetingGlick was made aware of patient.  Prior to transfer, SBP>100 and pt did require sedation with propofol.  Given that patient deteriorated and required operative management for any chance of survival, pt was transferred as benefits of transfer outweighed risks.  I advised family (including daughter) that patient was very critical.  They understand this and still request transfer.  Joya Gaskinsonald W Rakayla Ricklefs, MD 04/10/2013 [phone]

## 2013-04-27 NOTE — ED Notes (Signed)
3rd IV est L wrist, IVF wide open. Dr. Michaell CowingGross speaking with family at bedside. 100cc yellow urine in foley bag.

## 2013-04-27 DEATH — deceased

## 2013-04-28 ENCOUNTER — Encounter (HOSPITAL_COMMUNITY): Payer: Self-pay | Admitting: Surgery

## 2013-05-02 ENCOUNTER — Telehealth (INDEPENDENT_AMBULATORY_CARE_PROVIDER_SITE_OTHER): Payer: Self-pay | Admitting: Surgery

## 2013-05-02 NOTE — Telephone Encounter (Signed)
Discussed the patient's unfortunate demise with her gastroenterologist, Dr. Karilyn Cotaehman.  Questions answered.  He expressed understanding & appreciation  [name], M.D., F.A.C.S. Gastrointestinal and Minimally Invasive Surgery [facility] [address] [place], [place] [id number] [phone] Main / Paging

## 2013-05-28 DEATH — deceased

## 2013-07-07 ENCOUNTER — Encounter (INDEPENDENT_AMBULATORY_CARE_PROVIDER_SITE_OTHER): Payer: Self-pay

## 2014-02-18 ENCOUNTER — Encounter (INDEPENDENT_AMBULATORY_CARE_PROVIDER_SITE_OTHER): Payer: Self-pay
